# Patient Record
Sex: Female | Born: 1970 | Race: White | Hispanic: No | Marital: Single | Smoking: Never smoker
Health system: Southern US, Community
[De-identification: ages and names within clinical notes are randomized; demographics above are authoritative.]

## PROBLEM LIST (undated history)

## (undated) ENCOUNTER — Inpatient Hospital Stay (HOSPITAL_COMMUNITY): Payer: Self-pay

## (undated) DIAGNOSIS — I10 Essential (primary) hypertension: Secondary | ICD-10-CM

## (undated) DIAGNOSIS — E039 Hypothyroidism, unspecified: Secondary | ICD-10-CM

## (undated) DIAGNOSIS — K5900 Constipation, unspecified: Secondary | ICD-10-CM

## (undated) DIAGNOSIS — T8859XA Other complications of anesthesia, initial encounter: Secondary | ICD-10-CM

## (undated) DIAGNOSIS — R790 Abnormal level of blood mineral: Secondary | ICD-10-CM

## (undated) DIAGNOSIS — J189 Pneumonia, unspecified organism: Secondary | ICD-10-CM

## (undated) DIAGNOSIS — T4145XA Adverse effect of unspecified anesthetic, initial encounter: Secondary | ICD-10-CM

## (undated) HISTORY — PX: EXCISION MORTON'S NEUROMA: SHX5013

## (undated) HISTORY — PX: COLONOSCOPY: SHX174

---
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
## 1998-01-02 ENCOUNTER — Emergency Department (HOSPITAL_COMMUNITY): Admission: EM | Admit: 1998-01-02 | Discharge: 1998-01-02 | Payer: Self-pay | Admitting: Emergency Medicine

## 1998-03-22 ENCOUNTER — Other Ambulatory Visit: Admission: RE | Admit: 1998-03-22 | Discharge: 1998-03-22 | Payer: Self-pay | Admitting: Family Medicine

## 2001-10-15 ENCOUNTER — Other Ambulatory Visit: Admission: RE | Admit: 2001-10-15 | Discharge: 2001-10-15 | Payer: Self-pay | Admitting: Obstetrics and Gynecology

## 2002-04-08 ENCOUNTER — Other Ambulatory Visit: Admission: RE | Admit: 2002-04-08 | Discharge: 2002-04-08 | Payer: Self-pay | Admitting: Obstetrics and Gynecology

## 2005-08-31 ENCOUNTER — Ambulatory Visit (HOSPITAL_COMMUNITY): Admission: RE | Admit: 2005-08-31 | Discharge: 2005-08-31 | Payer: Self-pay | Admitting: Obstetrics and Gynecology

## 2005-11-12 ENCOUNTER — Other Ambulatory Visit: Admission: RE | Admit: 2005-11-12 | Discharge: 2005-11-12 | Payer: Self-pay | Admitting: Obstetrics and Gynecology

## 2010-10-07 ENCOUNTER — Encounter: Payer: Self-pay | Admitting: Obstetrics and Gynecology

## 2015-03-18 ENCOUNTER — Ambulatory Visit: Payer: Self-pay | Admitting: Family Medicine

## 2016-02-06 ENCOUNTER — Other Ambulatory Visit: Payer: Self-pay | Admitting: Orthopedic Surgery

## 2016-02-06 DIAGNOSIS — M5126 Other intervertebral disc displacement, lumbar region: Secondary | ICD-10-CM

## 2016-02-07 ENCOUNTER — Ambulatory Visit
Admission: RE | Admit: 2016-02-07 | Discharge: 2016-02-07 | Disposition: A | Discharge status: Home / Self Care | Payer: No Typology Code available for payment source | Source: Ambulatory Visit | Attending: Orthopedic Surgery | Admitting: Orthopedic Surgery

## 2016-02-07 DIAGNOSIS — M5126 Other intervertebral disc displacement, lumbar region: Secondary | ICD-10-CM

## 2016-03-09 ENCOUNTER — Encounter (HOSPITAL_COMMUNITY): Payer: Self-pay | Admitting: *Deleted

## 2016-03-09 ENCOUNTER — Other Ambulatory Visit: Payer: Self-pay | Admitting: Neurological Surgery

## 2016-03-09 NOTE — Progress Notes (Signed)
Pt denies cardiac history, chest pain or sob. 

## 2016-03-12 ENCOUNTER — Ambulatory Visit (HOSPITAL_COMMUNITY): Payer: BLUE CROSS/BLUE SHIELD | Admitting: Certified Registered Nurse Anesthetist

## 2016-03-12 ENCOUNTER — Encounter (HOSPITAL_COMMUNITY): Payer: Self-pay | Admitting: *Deleted

## 2016-03-12 ENCOUNTER — Encounter (HOSPITAL_COMMUNITY)
Admission: RE | Disposition: A | Discharge status: Home / Self Care | Payer: Self-pay | Source: Ambulatory Visit | Attending: Neurological Surgery

## 2016-03-12 ENCOUNTER — Other Ambulatory Visit: Payer: Self-pay

## 2016-03-12 ENCOUNTER — Ambulatory Visit (HOSPITAL_COMMUNITY): Payer: BLUE CROSS/BLUE SHIELD

## 2016-03-12 ENCOUNTER — Ambulatory Visit (HOSPITAL_COMMUNITY)
Admission: RE | Admit: 2016-03-12 | Discharge: 2016-03-12 | Disposition: A | Discharge status: Home / Self Care | Payer: BLUE CROSS/BLUE SHIELD | Source: Ambulatory Visit | Attending: Neurological Surgery | Admitting: Neurological Surgery

## 2016-03-12 DIAGNOSIS — E039 Hypothyroidism, unspecified: Secondary | ICD-10-CM | POA: Diagnosis not present

## 2016-03-12 DIAGNOSIS — I10 Essential (primary) hypertension: Secondary | ICD-10-CM | POA: Diagnosis not present

## 2016-03-12 DIAGNOSIS — M5127 Other intervertebral disc displacement, lumbosacral region: Secondary | ICD-10-CM | POA: Diagnosis present

## 2016-03-12 DIAGNOSIS — M5116 Intervertebral disc disorders with radiculopathy, lumbar region: Secondary | ICD-10-CM | POA: Insufficient documentation

## 2016-03-12 DIAGNOSIS — Z419 Encounter for procedure for purposes other than remedying health state, unspecified: Secondary | ICD-10-CM

## 2016-03-12 HISTORY — DX: Pneumonia, unspecified organism: J18.9

## 2016-03-12 HISTORY — DX: Essential (primary) hypertension: I10

## 2016-03-12 HISTORY — PX: LUMBAR LAMINECTOMY/DECOMPRESSION MICRODISCECTOMY: SHX5026

## 2016-03-12 HISTORY — DX: Other complications of anesthesia, initial encounter: T88.59XA

## 2016-03-12 HISTORY — DX: Hypothyroidism, unspecified: E03.9

## 2016-03-12 HISTORY — DX: Abnormal level of blood mineral: R79.0

## 2016-03-12 HISTORY — DX: Adverse effect of unspecified anesthetic, initial encounter: T41.45XA

## 2016-03-12 HISTORY — DX: Constipation, unspecified: K59.00

## 2016-03-12 LAB — CBC
HCT: 39.9 % (ref 36.0–46.0)
Hemoglobin: 13.5 g/dL (ref 12.0–15.0)
MCH: 32 pg (ref 26.0–34.0)
MCHC: 33.8 g/dL (ref 30.0–36.0)
MCV: 94.5 fL (ref 78.0–100.0)
PLATELETS: 220 10*3/uL (ref 150–400)
RBC: 4.22 MIL/uL (ref 3.87–5.11)
RDW: 12.5 % (ref 11.5–15.5)
WBC: 4.9 10*3/uL (ref 4.0–10.5)

## 2016-03-12 LAB — BASIC METABOLIC PANEL
Anion gap: 5 (ref 5–15)
BUN: 20 mg/dL (ref 6–20)
CALCIUM: 8.9 mg/dL (ref 8.9–10.3)
CHLORIDE: 110 mmol/L (ref 101–111)
CO2: 23 mmol/L (ref 22–32)
CREATININE: 0.69 mg/dL (ref 0.44–1.00)
GFR calc non Af Amer: >60 mL/min (ref >60)
GLUCOSE: 88 mg/dL (ref 65–99)
Potassium: 4.5 mmol/L (ref 3.5–5.1)
SODIUM: 138 mmol/L (ref 135–145)

## 2016-03-12 LAB — SURGICAL PCR SCREEN
MRSA, PCR: NEGATIVE
STAPHYLOCOCCUS AUREUS: NEGATIVE

## 2016-03-12 LAB — HCG, SERUM, QUALITATIVE: Preg, Serum: NEGATIVE

## 2016-03-12 SURGERY — LUMBAR LAMINECTOMY/DECOMPRESSION MICRODISCECTOMY 1 LEVEL
Anesthesia: General | Site: Spine Lumbar | Laterality: Right

## 2016-03-12 MED ORDER — ONDANSETRON HCL 4 MG/2ML IJ SOLN: 4.0000 mg | INTRAMUSCULAR | Status: DC | PRN

## 2016-03-12 MED ORDER — SODIUM CHLORIDE 0.9% FLUSH: 3.0000 mL | Freq: Two times a day (BID) | INTRAVENOUS | Status: DC

## 2016-03-12 MED ORDER — CEFAZOLIN IN D5W 1 GM/50ML IV SOLN
1.0000 g | Freq: Three times a day (TID) | INTRAVENOUS | Status: DC
  Administered 2016-03-12: 1 g via INTRAVENOUS
  Filled 2016-03-12: qty 50

## 2016-03-12 MED ORDER — GABAPENTIN 300 MG PO CAPS: 300.0000 mg | ORAL_CAPSULE | Freq: Three times a day (TID) | ORAL | Status: DC

## 2016-03-12 MED ORDER — VITAMIN K2 100 MCG PO CAPS: 100.0000 ug | ORAL_CAPSULE | Freq: Every day | ORAL | Status: DC

## 2016-03-12 MED ORDER — LIOTHYRONINE SODIUM 25 MCG PO TABS: 25.0000 ug | ORAL_TABLET | Freq: Every day | ORAL | Status: DC

## 2016-03-12 MED ORDER — CELECOXIB 200 MG PO CAPS
200.0000 mg | ORAL_CAPSULE | Freq: Two times a day (BID) | ORAL | Status: DC
  Administered 2016-03-12: 200 mg via ORAL
  Filled 2016-03-12: qty 1

## 2016-03-12 MED ORDER — LIDOCAINE 2% (20 MG/ML) 5 ML SYRINGE
INTRAMUSCULAR | Status: DC | PRN
  Administered 2016-03-12: 60 mg via INTRAVENOUS

## 2016-03-12 MED ORDER — KETOROLAC TROMETHAMINE 30 MG/ML IJ SOLN
INTRAMUSCULAR | Status: DC | PRN
  Administered 2016-03-12: 30 mg

## 2016-03-12 MED ORDER — BUPIVACAINE-EPINEPHRINE (PF) 0.5% -1:200000 IJ SOLN
INTRAMUSCULAR | Status: DC | PRN
  Administered 2016-03-12: 10 mL

## 2016-03-12 MED ORDER — HYDROMORPHONE HCL 1 MG/ML IJ SOLN
INTRAMUSCULAR | Status: AC
  Filled 2016-03-12: qty 1

## 2016-03-12 MED ORDER — THYROID 162.5 MG PO TABS: 162.5000 mg | ORAL_TABLET | Freq: Every day | ORAL | Status: DC

## 2016-03-12 MED ORDER — NEOSTIGMINE METHYLSULFATE 5 MG/5ML IV SOSY
PREFILLED_SYRINGE | INTRAVENOUS | Status: AC
  Filled 2016-03-12: qty 10

## 2016-03-12 MED ORDER — GLYCOPYRROLATE 0.2 MG/ML IV SOSY
PREFILLED_SYRINGE | INTRAVENOUS | Status: DC | PRN
  Administered 2016-03-12: .2 mg via INTRAVENOUS

## 2016-03-12 MED ORDER — PHENYLEPHRINE 40 MCG/ML (10ML) SYRINGE FOR IV PUSH (FOR BLOOD PRESSURE SUPPORT)
PREFILLED_SYRINGE | INTRAVENOUS | Status: AC
  Filled 2016-03-12: qty 30

## 2016-03-12 MED ORDER — KETOROLAC TROMETHAMINE 30 MG/ML IJ SOLN
INTRAMUSCULAR | Status: AC
  Filled 2016-03-12: qty 1

## 2016-03-12 MED ORDER — HYDROCODONE-ACETAMINOPHEN 7.5-325 MG PO TABS: 1.0000 | ORAL_TABLET | Freq: Four times a day (QID) | ORAL | Status: DC | PRN

## 2016-03-12 MED ORDER — 0.9 % SODIUM CHLORIDE (POUR BTL) OPTIME
TOPICAL | Status: DC | PRN
  Administered 2016-03-12: 1000 mL

## 2016-03-12 MED ORDER — THROMBIN 5000 UNITS EX SOLR
CUTANEOUS | Status: DC | PRN
  Administered 2016-03-12 (×2): 5000 [IU] via TOPICAL

## 2016-03-12 MED ORDER — POTASSIUM CHLORIDE IN NACL 20-0.9 MEQ/L-% IV SOLN
100.0000 mL/h | INTRAVENOUS | Status: DC
  Filled 2016-03-12 (×2): qty 1000

## 2016-03-12 MED ORDER — CEFAZOLIN SODIUM-DEXTROSE 2-4 GM/100ML-% IV SOLN
2.0000 g | INTRAVENOUS | Status: AC
  Administered 2016-03-12: 2 g via INTRAVENOUS
  Filled 2016-03-12: qty 100

## 2016-03-12 MED ORDER — PROPOFOL 10 MG/ML IV BOLUS
INTRAVENOUS | Status: DC | PRN
  Administered 2016-03-12: 130 mg via INTRAVENOUS

## 2016-03-12 MED ORDER — BISACODYL 10 MG RE SUPP
10.0000 mg | Freq: Every day | RECTAL | Status: DC | PRN
Start: 2016-03-12 — End: 2016-03-13

## 2016-03-12 MED ORDER — NEOSTIGMINE METHYLSULFATE 5 MG/5ML IV SOSY
PREFILLED_SYRINGE | INTRAVENOUS | Status: DC | PRN
  Administered 2016-03-12: 2 mg via INTRAVENOUS

## 2016-03-12 MED ORDER — METHYLPREDNISOLONE ACETATE 80 MG/ML IJ SUSP
INTRAMUSCULAR | Status: DC | PRN
  Administered 2016-03-12: 80 mg

## 2016-03-12 MED ORDER — LIDOCAINE 2% (20 MG/ML) 5 ML SYRINGE
INTRAMUSCULAR | Status: AC
  Filled 2016-03-12: qty 15

## 2016-03-12 MED ORDER — ADRENAL 200 MG PO CAPS: ORAL_CAPSULE | Freq: Every day | ORAL | Status: DC

## 2016-03-12 MED ORDER — BUPIVACAINE LIPOSOME 1.3 % IJ SUSP: INTRAMUSCULAR | Status: DC | PRN

## 2016-03-12 MED ORDER — ROCURONIUM BROMIDE 10 MG/ML (PF) SYRINGE
PREFILLED_SYRINGE | INTRAVENOUS | Status: DC | PRN
  Administered 2016-03-12: 40 mg via INTRAVENOUS

## 2016-03-12 MED ORDER — OXYCODONE HCL ER 10 MG PO T12A
20.0000 mg | EXTENDED_RELEASE_TABLET | Freq: Two times a day (BID) | ORAL | Status: DC
  Administered 2016-03-12: 20 mg via ORAL
  Filled 2016-03-12: qty 2

## 2016-03-12 MED ORDER — HYDROMORPHONE HCL 1 MG/ML IJ SOLN
0.2500 mg | INTRAMUSCULAR | Status: DC | PRN
  Administered 2016-03-12 (×2): 0.5 mg via INTRAVENOUS

## 2016-03-12 MED ORDER — MUPIROCIN 2 % EX OINT
1.0000 "application " | TOPICAL_OINTMENT | Freq: Once | CUTANEOUS | Status: AC
  Administered 2016-03-12: 1 via TOPICAL
  Filled 2016-03-12: qty 22

## 2016-03-12 MED ORDER — BIOTIN 1 MG PO CAPS: 1.0000 mg | ORAL_CAPSULE | Freq: Every day | ORAL | Status: DC

## 2016-03-12 MED ORDER — PROMETHAZINE HCL 25 MG/ML IJ SOLN: 6.2500 mg | INTRAMUSCULAR | Status: DC | PRN

## 2016-03-12 MED ORDER — MIDAZOLAM HCL 5 MG/5ML IJ SOLN
INTRAMUSCULAR | Status: DC | PRN
  Administered 2016-03-12: 2 mg via INTRAVENOUS
  Administered 2016-03-12 (×2): 1 mg via INTRAVENOUS

## 2016-03-12 MED ORDER — ACETAMINOPHEN 10 MG/ML IV SOLN
INTRAVENOUS | Status: AC
  Filled 2016-03-12: qty 100

## 2016-03-12 MED ORDER — MIDAZOLAM HCL 2 MG/2ML IJ SOLN
INTRAMUSCULAR | Status: AC
  Filled 2016-03-12: qty 2

## 2016-03-12 MED ORDER — GERMANIUM 10 MG PO CAPS: 10.0000 mg | ORAL_CAPSULE | Freq: Every day | ORAL | Status: DC

## 2016-03-12 MED ORDER — FLEET ENEMA 7-19 GM/118ML RE ENEM: 1.0000 | ENEMA | Freq: Once | RECTAL | Status: DC | PRN

## 2016-03-12 MED ORDER — ONDANSETRON HCL 4 MG/2ML IJ SOLN
INTRAMUSCULAR | Status: DC | PRN
  Administered 2016-03-12: 4 mg via INTRAVENOUS

## 2016-03-12 MED ORDER — MUPIROCIN 2 % EX OINT: 1.0000 "application " | TOPICAL_OINTMENT | Freq: Once | CUTANEOUS | Status: DC

## 2016-03-12 MED ORDER — MAGNESIUM CITRATE PO SOLN: 1.0000 | Freq: Once | ORAL | Status: DC | PRN

## 2016-03-12 MED ORDER — PYRIDOXINE HCL 25 MG PO TABS: 25.0000 mg | ORAL_TABLET | Freq: Every day | ORAL | Status: DC

## 2016-03-12 MED ORDER — VITAMIN D 1000 UNITS PO TABS: 1000.0000 [IU] | ORAL_TABLET | Freq: Every day | ORAL | Status: DC

## 2016-03-12 MED ORDER — OXYCODONE HCL 5 MG PO TABS: 5.0000 mg | ORAL_TABLET | ORAL | Status: DC | PRN

## 2016-03-12 MED ORDER — PHENOL 1.4 % MT LIQD: 1.0000 | OROMUCOSAL | Status: DC | PRN

## 2016-03-12 MED ORDER — CHLORHEXIDINE GLUCONATE CLOTH 2 % EX PADS: 6.0000 | MEDICATED_PAD | Freq: Once | CUTANEOUS | Status: DC

## 2016-03-12 MED ORDER — VITAMIN C 500 MG PO CAPS
500.0000 mg | ORAL_CAPSULE | Freq: Every day | ORAL | Status: DC
Start: 2016-03-12 — End: 2016-03-12

## 2016-03-12 MED ORDER — DOCUSATE SODIUM 100 MG PO CAPS: 100.0000 mg | ORAL_CAPSULE | Freq: Two times a day (BID) | ORAL | Status: DC

## 2016-03-12 MED ORDER — B COMPLEX PO TABS: 1.0000 | ORAL_TABLET | Freq: Every day | ORAL | Status: DC

## 2016-03-12 MED ORDER — MAGNESIUM SULFATE 70 MG PO CAPS: 70.0000 mg | ORAL_CAPSULE | Freq: Every day | ORAL | Status: DC

## 2016-03-12 MED ORDER — GLYCOPYRROLATE 0.2 MG/ML IV SOSY
PREFILLED_SYRINGE | INTRAVENOUS | Status: AC
Start: 2016-03-12 — End: 2016-03-12
  Filled 2016-03-12: qty 6

## 2016-03-12 MED ORDER — ALUM & MAG HYDROXIDE-SIMETH 200-200-20 MG/5ML PO SUSP: 30.0000 mL | Freq: Four times a day (QID) | ORAL | Status: DC | PRN

## 2016-03-12 MED ORDER — SODIUM CHLORIDE 0.9 % IR SOLN
Status: DC | PRN
  Administered 2016-03-12: 500 mL

## 2016-03-12 MED ORDER — FENTANYL CITRATE (PF) 250 MCG/5ML IJ SOLN
INTRAMUSCULAR | Status: AC
  Filled 2016-03-12: qty 5

## 2016-03-12 MED ORDER — METHOCARBAMOL 750 MG PO TABS: 750.0000 mg | ORAL_TABLET | Freq: Four times a day (QID) | ORAL | Status: DC

## 2016-03-12 MED ORDER — PHENYLEPHRINE 40 MCG/ML (10ML) SYRINGE FOR IV PUSH (FOR BLOOD PRESSURE SUPPORT)
PREFILLED_SYRINGE | INTRAVENOUS | Status: DC | PRN
  Administered 2016-03-12: 120 ug via INTRAVENOUS

## 2016-03-12 MED ORDER — PANTOPRAZOLE SODIUM 20 MG PO TBEC
20.0000 mg | DELAYED_RELEASE_TABLET | Freq: Every day | ORAL | Status: DC
Start: 2016-03-12 — End: 2016-03-13
  Filled 2016-03-12: qty 1

## 2016-03-12 MED ORDER — HEMOSTATIC AGENTS (NO CHARGE) OPTIME
TOPICAL | Status: DC | PRN
  Administered 2016-03-12: 1 via TOPICAL

## 2016-03-12 MED ORDER — DEXAMETHASONE SODIUM PHOSPHATE 10 MG/ML IJ SOLN
INTRAMUSCULAR | Status: DC | PRN
  Administered 2016-03-12: 5 mg via INTRAVENOUS

## 2016-03-12 MED ORDER — DEXAMETHASONE SODIUM PHOSPHATE 10 MG/ML IJ SOLN
INTRAMUSCULAR | Status: AC
  Filled 2016-03-12: qty 1

## 2016-03-12 MED ORDER — CEFAZOLIN SODIUM 1 G IJ SOLR
INTRAMUSCULAR | Status: AC
  Filled 2016-03-12: qty 20

## 2016-03-12 MED ORDER — SODIUM CHLORIDE 0.9 % IJ SOLN: INTRAMUSCULAR | Status: DC | PRN

## 2016-03-12 MED ORDER — MENTHOL 3 MG MT LOZG: 1.0000 | LOZENGE | OROMUCOSAL | Status: DC | PRN

## 2016-03-12 MED ORDER — HYDROCHLOROTHIAZIDE 12.5 MG PO CAPS: 12.5000 mg | ORAL_CAPSULE | Freq: Every day | ORAL | Status: DC

## 2016-03-12 MED ORDER — BUPIVACAINE HCL (PF) 0.25 % IJ SOLN
INTRAMUSCULAR | Status: DC | PRN
  Administered 2016-03-12: 1 mL

## 2016-03-12 MED ORDER — BUPIVACAINE LIPOSOME 1.3 % IJ SUSP
20.0000 mL | INTRAMUSCULAR | Status: DC
  Filled 2016-03-12: qty 20

## 2016-03-12 MED ORDER — FENTANYL CITRATE (PF) 100 MCG/2ML IJ SOLN
INTRAMUSCULAR | Status: DC | PRN
  Administered 2016-03-12: 25 ug via INTRAVENOUS
  Administered 2016-03-12: 50 ug via INTRAVENOUS
  Administered 2016-03-12 (×2): 25 ug via INTRAVENOUS
  Administered 2016-03-12: 50 ug via INTRAVENOUS

## 2016-03-12 MED ORDER — PHENYLEPHRINE 40 MCG/ML (10ML) SYRINGE FOR IV PUSH (FOR BLOOD PRESSURE SUPPORT)
PREFILLED_SYRINGE | INTRAVENOUS | Status: AC
  Filled 2016-03-12: qty 10

## 2016-03-12 MED ORDER — SODIUM CHLORIDE 0.9% FLUSH: 3.0000 mL | INTRAVENOUS | Status: DC | PRN

## 2016-03-12 MED ORDER — ONDANSETRON HCL 4 MG/2ML IJ SOLN
INTRAMUSCULAR | Status: AC
  Filled 2016-03-12: qty 6

## 2016-03-12 MED ORDER — METHOCARBAMOL 750 MG PO TABS
750.0000 mg | ORAL_TABLET | Freq: Four times a day (QID) | ORAL | Status: DC
  Administered 2016-03-12: 750 mg via ORAL
  Filled 2016-03-12: qty 1

## 2016-03-12 MED ORDER — SENNA 8.6 MG PO TABS
1.0000 | ORAL_TABLET | Freq: Two times a day (BID) | ORAL | Status: DC
Start: 2016-03-12 — End: 2016-03-13

## 2016-03-12 MED ORDER — BARBERRY-OREG GRAPE-GOLDENSEAL 200-200-50 MG PO CAPS: 1.0000 | ORAL_CAPSULE | Freq: Every day | ORAL | Status: DC

## 2016-03-12 MED ORDER — GABAPENTIN 300 MG PO CAPS
300.0000 mg | ORAL_CAPSULE | Freq: Three times a day (TID) | ORAL | Status: DC
  Administered 2016-03-12: 300 mg via ORAL
  Filled 2016-03-12: qty 1

## 2016-03-12 MED ORDER — LACTATED RINGERS IV SOLN
INTRAVENOUS | Status: DC
  Administered 2016-03-12 (×2): via INTRAVENOUS

## 2016-03-12 MED ORDER — LIDOCAINE-EPINEPHRINE 2 %-1:100000 IJ SOLN
INTRAMUSCULAR | Status: DC | PRN
  Administered 2016-03-12: 10 mL

## 2016-03-12 MED ORDER — ACETAMINOPHEN 500 MG PO TABS
1000.0000 mg | ORAL_TABLET | Freq: Four times a day (QID) | ORAL | Status: DC
  Administered 2016-03-12: 1000 mg via ORAL
  Filled 2016-03-12: qty 2

## 2016-03-12 MED ORDER — TESTOSTERONE CYPIONATE 100 MG/ML IM SOLN: 100.0000 mg | Freq: Once | INTRAMUSCULAR | Status: DC

## 2016-03-12 MED ORDER — ROCURONIUM BROMIDE 50 MG/5ML IV SOLN
INTRAVENOUS | Status: AC
  Filled 2016-03-12: qty 4

## 2016-03-12 MED ORDER — EPHEDRINE 5 MG/ML INJ
INTRAVENOUS | Status: AC
  Filled 2016-03-12: qty 30

## 2016-03-12 MED ORDER — ACETAMINOPHEN 10 MG/ML IV SOLN
INTRAVENOUS | Status: DC | PRN
  Administered 2016-03-12: 1000 mg via INTRAVENOUS

## 2016-03-12 MED ORDER — THROMBIN 5000 UNITS EX SOLR
OROMUCOSAL | Status: DC | PRN
  Administered 2016-03-12: 5 mL via TOPICAL

## 2016-03-12 SURGICAL SUPPLY — 71 items
ADH SKN CLS APL DERMABOND .7 (GAUZE/BANDAGES/DRESSINGS) ×1
APL SKNCLS STERI-STRIP NONHPOA (GAUZE/BANDAGES/DRESSINGS)
BAG DECANTER FOR FLEXI CONT (MISCELLANEOUS) ×3 IMPLANT
BENZOIN TINCTURE PRP APPL 2/3 (GAUZE/BANDAGES/DRESSINGS) IMPLANT
BIT DRILL NEURO 2X3.1 SFT TUCH (MISCELLANEOUS) ×1 IMPLANT
BLADE CLIPPER SURG (BLADE) IMPLANT
BLADE SURG 11 STRL SS (BLADE) ×3 IMPLANT
BUR ROUND FLUTED 5 RND (BURR) ×2 IMPLANT
BUR ROUND FLUTED 5MM RND (BURR) ×1
CANISTER SUCT 3000ML PPV (MISCELLANEOUS) ×6 IMPLANT
CHLORAPREP W/TINT 26ML (MISCELLANEOUS) ×3 IMPLANT
CLOSURE WOUND 1/2 X4 (GAUZE/BANDAGES/DRESSINGS)
DECANTER SPIKE VIAL GLASS SM (MISCELLANEOUS) ×3 IMPLANT
DERMABOND ADVANCED (GAUZE/BANDAGES/DRESSINGS) ×2
DERMABOND ADVANCED .7 DNX12 (GAUZE/BANDAGES/DRESSINGS) ×1 IMPLANT
DRAPE MICROSCOPE LEICA (MISCELLANEOUS) ×3 IMPLANT
DRAPE POUCH INSTRU U-SHP 10X18 (DRAPES) ×3 IMPLANT
DRAPE SURG 17X23 STRL (DRAPES) ×3 IMPLANT
DRILL NEURO 2X3.1 SOFT TOUCH (MISCELLANEOUS) ×3
DRSG OPSITE POSTOP 3X4 (GAUZE/BANDAGES/DRESSINGS) ×2 IMPLANT
ELECT BLADE 4.0 EZ CLEAN MEGAD (MISCELLANEOUS) ×3
ELECT REM PT RETURN 9FT ADLT (ELECTROSURGICAL) ×3
ELECTRODE BLDE 4.0 EZ CLN MEGD (MISCELLANEOUS) IMPLANT
ELECTRODE REM PT RTRN 9FT ADLT (ELECTROSURGICAL) ×1 IMPLANT
GAUZE SPONGE 4X4 12PLY STRL (GAUZE/BANDAGES/DRESSINGS) IMPLANT
GAUZE SPONGE 4X4 16PLY XRAY LF (GAUZE/BANDAGES/DRESSINGS) IMPLANT
GLOVE BIOGEL PI IND STRL 7.0 (GLOVE) IMPLANT
GLOVE BIOGEL PI IND STRL 7.5 (GLOVE) ×1 IMPLANT
GLOVE BIOGEL PI IND STRL 8 (GLOVE) IMPLANT
GLOVE BIOGEL PI INDICATOR 7.0 (GLOVE) ×2
GLOVE BIOGEL PI INDICATOR 7.5 (GLOVE) ×4
GLOVE BIOGEL PI INDICATOR 8 (GLOVE) ×2
GLOVE ECLIPSE 7.5 STRL STRAW (GLOVE) ×4 IMPLANT
GLOVE EXAM NITRILE LRG STRL (GLOVE) IMPLANT
GLOVE EXAM NITRILE MD LF STRL (GLOVE) IMPLANT
GLOVE EXAM NITRILE XL STR (GLOVE) IMPLANT
GLOVE EXAM NITRILE XS STR PU (GLOVE) IMPLANT
GLOVE SS BIOGEL STRL SZ 7 (GLOVE) ×2 IMPLANT
GLOVE SUPERSENSE BIOGEL SZ 7 (GLOVE) ×4
GOWN STRL REUS W/ TWL LRG LVL3 (GOWN DISPOSABLE) ×1 IMPLANT
GOWN STRL REUS W/ TWL XL LVL3 (GOWN DISPOSABLE) IMPLANT
GOWN STRL REUS W/TWL LRG LVL3 (GOWN DISPOSABLE) ×3
GOWN STRL REUS W/TWL XL LVL3 (GOWN DISPOSABLE) ×6
HEMOSTAT POWDER KIT SURGIFOAM (HEMOSTASIS) ×3 IMPLANT
KIT BASIN OR (CUSTOM PROCEDURE TRAY) ×3 IMPLANT
KIT ROOM TURNOVER OR (KITS) ×3 IMPLANT
LIGHT SOURCE ANGLE TIP STR 7FT (MISCELLANEOUS) ×2 IMPLANT
NDL HYPO 21X1.5 SAFETY (NEEDLE) ×1 IMPLANT
NDL HYPO 25X1 1.5 SAFETY (NEEDLE) ×1 IMPLANT
NEEDLE HYPO 21X1.5 SAFETY (NEEDLE) ×6 IMPLANT
NEEDLE HYPO 25X1 1.5 SAFETY (NEEDLE) ×3 IMPLANT
NS IRRIG 1000ML POUR BTL (IV SOLUTION) ×3 IMPLANT
PACK LAMINECTOMY NEURO (CUSTOM PROCEDURE TRAY) ×3 IMPLANT
PACK UNIVERSAL I (CUSTOM PROCEDURE TRAY) ×3 IMPLANT
PAD ARMBOARD 7.5X6 YLW CONV (MISCELLANEOUS) ×9 IMPLANT
PATTIES SURGICAL .5X1.5 (GAUZE/BANDAGES/DRESSINGS) ×3 IMPLANT
RUBBERBAND STERILE (MISCELLANEOUS) ×6 IMPLANT
SPONGE NEURO XRAY DETECT 1X3 (DISPOSABLE) ×3 IMPLANT
SPONGE SURGIFOAM ABS GEL SZ50 (HEMOSTASIS) ×3 IMPLANT
STRIP CLOSURE SKIN 1/2X4 (GAUZE/BANDAGES/DRESSINGS) IMPLANT
SUT VIC AB 0 CT1 18XCR BRD8 (SUTURE) ×1 IMPLANT
SUT VIC AB 0 CT1 8-18 (SUTURE) ×3
SUT VIC AB 2-0 CT1 18 (SUTURE) ×3 IMPLANT
SUT VIC AB 4-0 PS2 27 (SUTURE) ×3 IMPLANT
SYR 20CC LL (SYRINGE) ×3 IMPLANT
SYR 30ML LL (SYRINGE) ×5 IMPLANT
TOWEL OR 17X24 6PK STRL BLUE (TOWEL DISPOSABLE) ×3 IMPLANT
TOWEL OR 17X26 10 PK STRL BLUE (TOWEL DISPOSABLE) ×3 IMPLANT
TUBE CONNECTING 12'X1/4 (SUCTIONS) ×1
TUBE CONNECTING 12X1/4 (SUCTIONS) ×2 IMPLANT
WATER STERILE IRR 1000ML POUR (IV SOLUTION) ×3 IMPLANT

## 2016-03-12 NOTE — Progress Notes (Signed)
PHARMACIST - PHYSICIAN ORDER COMMUNICATION  CONCERNING: P&T Medication Policy on Herbal Medications  DESCRIPTION:  This patient's order for: multiple OTC products -- have been stopped  This product(s) is classified as an "herbal" or natural product. Due to a lack of definitive safety studies or FDA approval, nonstandard manufacturing practices, plus the potential risk of unknown drug-drug interactions while on inpatient medications, the Pharmacy and Therapeutics Committee does not permit the use of "herbal" or natural products of this type within [facility].   ACTION TAKEN: The pharmacy department is unable to verify this order at this time and your patient has been informed of this safety policy. Please reevaluate patient's clinical condition at discharge and address if the herbal or natural product(s) should be resumed at that time.

## 2016-03-12 NOTE — Anesthesia Preprocedure Evaluation (Addendum)
Anesthesia Evaluation  Patient identified by MRN, date of birth, ID band Patient awake    Reviewed: Allergy & Precautions, NPO status , Patient's Chart, lab work & pertinent test results  Airway Mallampati: I  TM Distance: >3 FB Neck ROM: Full    Dental  (+) Dental Advisory Given, Teeth Intact   Pulmonary neg pulmonary ROS,    breath sounds clear to auscultation       Cardiovascular hypertension, Pt. on medications  Rhythm:Regular Rate:Normal     Neuro/Psych negative neurological ROS     GI/Hepatic negative GI ROS, Neg liver ROS,   Endo/Other  Hypothyroidism   Renal/GU negative Renal ROS     Musculoskeletal negative musculoskeletal ROS (+)   Abdominal   Peds  Hematology negative hematology ROS (+)   Anesthesia Other Findings   Reproductive/Obstetrics                           Lab Results  Component Value Date   WBC 4.9 03/12/2016   HGB 13.5 03/12/2016   HCT 39.9 03/12/2016   MCV 94.5 03/12/2016   PLT 220 03/12/2016   Lab Results  Component Value Date   CREATININE 0.69 03/12/2016   BUN 20 03/12/2016   NA 138 03/12/2016   K 4.5 03/12/2016   CL 110 03/12/2016   CO2 23 03/12/2016    Anesthesia Physical Anesthesia Plan  ASA: II  Anesthesia Plan: General   Post-op Pain Management:    Induction: Intravenous  Airway Management Planned: Oral ETT  Additional Equipment:   Intra-op Plan:   Post-operative Plan: Extubation in OR  Informed Consent: I have reviewed the patients History and Physical, chart, labs and discussed the procedure including the risks, benefits and alternatives for the proposed anesthesia with the patient or authorized representative who has indicated his/her understanding and acceptance.   Dental advisory given  Plan Discussed with: CRNA  Anesthesia Plan Comments:         Anesthesia Quick Evaluation

## 2016-03-12 NOTE — Anesthesia Postprocedure Evaluation (Signed)
Anesthesia Post Note  Patient: Michelle Mann  Procedure(s) Performed: Procedure(s) (LRB): Right Lumbar five-dacral-one Microdiskectomy (Right)  Patient location during evaluation: PACU Anesthesia Type: General Level of consciousness: awake and alert Pain management: pain level controlled Vital Signs Assessment: post-procedure vital signs reviewed and stable Respiratory status: spontaneous breathing, nonlabored ventilation and respiratory function stable Cardiovascular status: blood pressure returned to baseline and stable Postop Assessment: no signs of nausea or vomiting Anesthetic complications: no    Last Vitals:  Filed Vitals:   03/12/16 1522 03/12/16 1548  BP:  118/64  Pulse:  65  Temp: 36.4 C 36.8 C  Resp:  16    Last Pain:  Filed Vitals:   03/12/16 1601  PainSc: 8                  Calie Buttrey A

## 2016-03-12 NOTE — Op Note (Signed)
03/12/2016  1:57 PM  PATIENT:  Michelle Mann  45 y.o. female  PRE-OPERATIVE DIAGNOSIS:  Lumbar radiculopathy, herniated nucleus pulposus right L5-S1  POST-OPERATIVE DIAGNOSIS:  Same  PROCEDURE:  Right L5-S1 microdiscectomy  SURGEON:  Aldean Ast, MD  ASSISTANTS: Jovita Gamma, M.D.  ANESTHESIA:   General  DRAINS: None   SPECIMEN:  None  INDICATION FOR PROCEDURE: 66 showed woman with intractable right leg pain due to herniated nucleus pulposus at L5-S1. I recommended the above listed operation. Patient understood the risks, benefits, and alternatives and potential outcomes and wished to proceed.  PROCEDURE DETAILS: After smooth induction of general endotracheal anesthesia the patient was turned prone on the operating table on a Wilson frame.  The skin of the lumbar region was clipped of hair. It was wiped down with alcohol. The patient was then prepped and draped in usual sterile fashion.   Two spinal needles were inserted 1.5 cm right of the midline at approximately the L5-S1 level.  Intraoperative fluoroscopy was used to determine the level of the appropriate needle. The skin around this needle was infiltrated with a mixture of lidocaine and Marcaine with epinephrine. An approximately 2-1/2 cm incision was then made at this location.  Monopolar cautery was used to dissect through the subcutaneous tissues to the lumbodorsal fascia. This was then penetrated with a dilator. The level was then confirmed. The dilator was used to scrape the muscle from the lamina at the appropriate level. The tract was sequentially dilated and then the Maxcess retractor was inserted over the dilators.  The operating microscope was brought into the field. Using microsurgical technique the remaining adherent soft tissue was removed from the lamina. The high-speed drill was used to thin the inferior lamina of L5 and superior lamina of S1.  The hemilaminectomy was completed with Kerrison punches and  pituitary rongeurs.  The ligamentum flavum was elevated with a nerve hook and then resected with Kerrison punches. Epidural fat was suctioned away to reveal the thecal sac. Residual ligament was resected.  The S1 nerve root was identified. This was medialized.  The disc space was identified.  The annulus was opened sharply. A free disc fragment was identified and this was resected using pituitary rongeurs and Kerrisons. After this was complete I palpated the ventral edge of the thecal sac and nerve root to determine if there was no residual compression. I was satisfied that this was the case.  I irrigated vigorously with bacitracin saline. There was excellent hemostasis. I injected an concoction of plain Marcaine, Toradol, and Depo-Medrol into the epidural space.  The wound was closed in routine anatomic layers using interrupted Vicryl sutures. The skin was sealed with Dermabond.   PATIENT DISPOSITION:  PACU - hemodynamically stable.   Delay start of Pharmacological VTE agent (>24hrs) due to surgical blood loss or risk of bleeding:  yes '

## 2016-03-12 NOTE — Progress Notes (Signed)
Discharged instructions/education/ Rx given to patient with family at bedside and patient verbalized understanding. Patient moving and ambulating well. Emptying her bladder well. Tolerated dinner well with no complaints. Pain is mild to moderate per patient. No drainage,no redness and no swelling noted on incision site. Patient ambulated with family and NT to her transportation.

## 2016-03-12 NOTE — Transfer of Care (Signed)
Immediate Anesthesia Transfer of Care Note  Patient: Michelle Mann  Procedure(s) Performed: Procedure(s) with comments: Right Lumbar five-dacral-one Microdiskectomy (Right) - right  Patient Location: PACU  Anesthesia Type:General  Level of Consciousness: awake, alert  and oriented  Airway & Oxygen Therapy: Patient Spontanous Breathing  Post-op Assessment: Report given to RN and Post -op Vital signs reviewed and stable  Post vital signs: Reviewed and stable  Last Vitals:  Filed Vitals:   03/12/16 0716  BP: 120/62  Pulse: 89  Temp: 36.8 C  Resp: 20    Last Pain:  Filed Vitals:   03/12/16 0812  PainSc: 8       Patients Stated Pain Goal: 2 ([id number])  Complications: No apparent anesthesia complications

## 2016-03-12 NOTE — Anesthesia Procedure Notes (Signed)
Procedure Name: Intubation Date/Time: 03/12/2016 1:37 PM Performed by: Merdis Delay Pre-anesthesia Checklist: Patient identified, Emergency Drugs available, Suction available, Patient being monitored and Timeout performed Patient Re-evaluated:Patient Re-evaluated prior to inductionOxygen Delivery Method: Circle system utilized Preoxygenation: Pre-oxygenation with 100% oxygen Intubation Type: IV induction Ventilation: Mask ventilation without difficulty Laryngoscope Size: Mac and 3 Grade View: Grade I Tube type: Oral Tube size: 7.0 mm Number of attempts: 1 Placement Confirmation: ETT inserted through vocal cords under direct vision,  positive ETCO2,  CO2 detector and breath sounds checked- equal and bilateral Secured at: 22 cm Tube secured with: Tape Dental Injury: Teeth and Oropharynx as per pre-operative assessment  Comments: Preformed by Melynda Ripple CRNA

## 2016-03-12 NOTE — H&P (Signed)
CC:  No chief complaint on file.   HPI: Michelle Mann is a 45 y.o. female with medically intractable right leg pain.  She has a disc herniation at L5-S1 on the right.  She presents for discectomy.  PMH: Past Medical History  Diagnosis Date  . Hypothyroidism   . Hypertension     none since weight loss  . Pneumonia   . Constipation   . Complication of anesthesia     woke up during neuroma surgery  . Raised serum iron     PSH: Past Surgical History  Procedure Laterality Date  . Wisdom tooth extraction  1986  . Excision morton's neuroma Left     foot  . Colonoscopy      SH: Social History  Substance Use Topics  . Smoking status: Never Smoker   . Smokeless tobacco: Never Used  . Alcohol Use: Yes     Comment: socially    MEDS: Prior to Admission medications   Medication Sig Start Date End Date Taking? Authorizing Provider  ALOE PO Take 1 capsule by mouth daily.   Yes Historical Provider, MD  Ascorbic Acid (VITAMIN C) 500 MG CAPS Take 500 mg by mouth daily.   Yes Historical Provider, MD  b complex vitamins tablet Take 1 tablet by mouth daily.   Yes Historical Provider, MD  Barberry-Oreg Grape-Goldenseal 200-200-50 MG CAPS Take 1 tablet by mouth daily.   Yes Historical Provider, MD  Biotin 1 MG CAPS Take 1 mg by mouth daily.   Yes Historical Provider, MD  Cholecalciferol (VITAMIN D3) 1000 units CAPS Take 1,000 Units by mouth daily.   Yes Historical Provider, MD  hydrochlorothiazide (MICROZIDE) 12.5 MG capsule Take 12.5 mg by mouth daily.   Yes Historical Provider, MD  LIOTHYRONINE SODIUM PO Take 25 mcg by mouth daily. This a compounded medication - Liothyronine Sodium P3SR   Yes Historical Provider, MD  magnesium citrate SOLN Take 1 Bottle by mouth once as needed for mild constipation or severe constipation.   Yes Historical Provider, MD  MAGNESIUM GLYCINATE PLUS PO Take 1 tablet by mouth daily. In addition to mg sulfate   Yes Historical Provider, MD  Magnesium Sulfate 70  MG CAPS Take 70 mg by mouth daily.   Yes Historical Provider, MD  Menaquinone-7 (VITAMIN K2) 100 MCG CAPS Take 100 mcg by mouth daily.   Yes Historical Provider, MD  Misc Natural Products (ADRENAL PO) Take 1 tablet by mouth daily.   Yes Historical Provider, MD  Misc Natural Products (GERMANIUM) 10 MG CAPS Take 10 mg by mouth daily.   Yes Historical Provider, MD  Multiple Vitamins-Minerals (ZINC) LOZG Take 1 lozenge by mouth daily as needed. supplement   Yes Historical Provider, MD  Omega-3 Fatty Acids (FISH OIL) 1000 MG CAPS Take 1,000 mg by mouth daily.   Yes Historical Provider, MD  pyridOXINE (VITAMIN B-6) 25 MG tablet Take 25 mg by mouth daily.   Yes Historical Provider, MD  testosterone cypionate (DEPOTESTOTERONE CYPIONATE) 100 MG/ML injection Inject 100 mg into the muscle once. For IM use only-pt reports she will not have another injection per MD-had injection two weeks prior   Yes Historical Provider, MD  Thyroid 162.5 MG TABS Take 162.5 mg by mouth daily. Nature Thyroid   Yes Historical Provider, MD    ALLERGY: Allergies  Allergen Reactions  . Codeine Other (See Comments)    AGITATION    ROS: ROS  NEUROLOGIC EXAM: Awake, alert, oriented Memory and concentration grossly intact Speech  fluent, appropriate CN grossly intact Motor exam: Upper Extremities Deltoid Bicep Tricep Grip  Right 5/5 5/5 5/5 5/5  Left 5/5 5/5 5/5 5/5   Lower Extremity IP Quad PF DF EHL  Right 5/5 5/5 5/5 5/5 5/5  Left 5/5 5/5 5/5 5/5 5/5   Sensation grossly intact to LT  IMAGING: No new imaging  IMPRESSION: - 45 y.o. female with lumbar radiculopathy and right L5-S1 disc herniation  PLAN: - Right L5-S1 discectomy - I had a long discussion with the patient and discussed the risks and benefits of the operation as well as the alternatives.  She understands and wishes to proceed.

## 2016-03-12 NOTE — Discharge Summary (Signed)
Date of Admission: 03/12/2016  Date of Discharge: 03/12/2016  Admission Diagnosis: Lumbar radiculopathy, herniated nucleus pulposus right L5-S1  Discharge Diagnosis: Same   Procedure Performed: Right L5-S1 microdiscectomy  Attending: Kevan Ny Prestyn Mahn, MD  Hospital Course:  The patient was admitted for the above listed operation and had an uncomplicated post-operative course.  They were discharged in stable condition.  Follow up: 3 weeks    Medication List    TAKE these medications        ALOE PO  Take 1 capsule by mouth daily.     b complex vitamins tablet  Take 1 tablet by mouth daily.     Barberry-Oreg Grape-Goldenseal 200-200-50 MG Caps  Take 1 tablet by mouth daily.     Biotin 1 MG Caps  Take 1 mg by mouth daily.     Fish Oil 1000 MG Caps  Take 1,000 mg by mouth daily.     gabapentin 300 MG capsule  Commonly known as:  NEURONTIN  Take 1 capsule (300 mg total) by mouth 3 (three) times daily.     Germanium 10 MG Caps  Take 10 mg by mouth daily.     ADRENAL PO  Take 1 tablet by mouth daily.     hydrochlorothiazide 12.5 MG capsule  Commonly known as:  MICROZIDE  Take 12.5 mg by mouth daily.     HYDROcodone-acetaminophen 7.5-325 MG tablet  Commonly known as:  NORCO  Take 1 tablet by mouth every 6 (six) hours as needed for moderate pain.     LIOTHYRONINE SODIUM PO  Take 25 mcg by mouth daily. This a compounded medication - Liothyronine Sodium P3SR     magnesium citrate Soln  Take 1 Bottle by mouth once as needed for mild constipation or severe constipation.     MAGNESIUM GLYCINATE PLUS PO  Take 1 tablet by mouth daily. In addition to mg sulfate     Magnesium Sulfate 70 MG Caps  Take 70 mg by mouth daily.     methocarbamol 750 MG tablet  Commonly known as:  ROBAXIN  Take 1 tablet (750 mg total) by mouth 4 (four) times daily.     pyridOXINE 25 MG tablet  Commonly known as:  VITAMIN B-6  Take 25 mg by mouth daily.     testosterone cypionate 100  MG/ML injection  Commonly known as:  DEPOTESTOTERONE CYPIONATE  Inject 100 mg into the muscle once. For IM use only-pt reports she will not have another injection per MD-had injection two weeks prior     Thyroid 162.5 MG Tabs  Take 162.5 mg by mouth daily. Nature Thyroid     Vitamin C 500 MG Caps  Take 500 mg by mouth daily.     Vitamin D3 1000 units Caps  Take 1,000 Units by mouth daily.     Vitamin K2 100 MCG Caps  Take 100 mcg by mouth daily.     Zinc Lozg  Take 1 lozenge by mouth daily as needed. supplement

## 2016-03-12 NOTE — Progress Notes (Signed)
Transported per [facility] NT

## 2016-03-13 ENCOUNTER — Encounter (HOSPITAL_COMMUNITY): Payer: Self-pay | Admitting: Neurological Surgery

## 2016-06-12 DIAGNOSIS — D252 Subserosal leiomyoma of uterus: Secondary | ICD-10-CM | POA: Insufficient documentation

## 2016-07-31 ENCOUNTER — Encounter (HOSPITAL_COMMUNITY)
Admission: EM | Disposition: A | Discharge status: Home / Self Care | Payer: Self-pay | Source: Home / Self Care | Attending: Physician Assistant

## 2016-07-31 ENCOUNTER — Emergency Department (HOSPITAL_COMMUNITY): Payer: BLUE CROSS/BLUE SHIELD | Admitting: Certified Registered Nurse Anesthetist

## 2016-07-31 ENCOUNTER — Observation Stay (HOSPITAL_COMMUNITY)
Admission: EM | Admit: 2016-07-31 | Discharge: 2016-08-01 | Disposition: A | Discharge status: Home / Self Care | Payer: BLUE CROSS/BLUE SHIELD | Attending: Orthopedic Surgery | Admitting: Orthopedic Surgery

## 2016-07-31 ENCOUNTER — Encounter (HOSPITAL_COMMUNITY): Payer: Self-pay | Admitting: Emergency Medicine

## 2016-07-31 ENCOUNTER — Emergency Department (HOSPITAL_COMMUNITY): Payer: BLUE CROSS/BLUE SHIELD

## 2016-07-31 DIAGNOSIS — S61412A Laceration without foreign body of left hand, initial encounter: Secondary | ICD-10-CM | POA: Diagnosis not present

## 2016-07-31 DIAGNOSIS — W06XXXA Fall from bed, initial encounter: Secondary | ICD-10-CM | POA: Diagnosis not present

## 2016-07-31 DIAGNOSIS — S62327B Displaced fracture of shaft of fifth metacarpal bone, left hand, initial encounter for open fracture: Principal | ICD-10-CM | POA: Insufficient documentation

## 2016-07-31 DIAGNOSIS — Z885 Allergy status to narcotic agent status: Secondary | ICD-10-CM | POA: Insufficient documentation

## 2016-07-31 DIAGNOSIS — Z888 Allergy status to other drugs, medicaments and biological substances status: Secondary | ICD-10-CM | POA: Diagnosis not present

## 2016-07-31 DIAGNOSIS — Y998 Other external cause status: Secondary | ICD-10-CM | POA: Insufficient documentation

## 2016-07-31 DIAGNOSIS — Z823 Family history of stroke: Secondary | ICD-10-CM | POA: Diagnosis not present

## 2016-07-31 DIAGNOSIS — Y93E9 Activity, other interior property and clothing maintenance: Secondary | ICD-10-CM | POA: Insufficient documentation

## 2016-07-31 DIAGNOSIS — Z8249 Family history of ischemic heart disease and other diseases of the circulatory system: Secondary | ICD-10-CM | POA: Insufficient documentation

## 2016-07-31 DIAGNOSIS — E039 Hypothyroidism, unspecified: Secondary | ICD-10-CM | POA: Diagnosis not present

## 2016-07-31 DIAGNOSIS — Y92092 Bedroom in other non-institutional residence as the place of occurrence of the external cause: Secondary | ICD-10-CM | POA: Insufficient documentation

## 2016-07-31 DIAGNOSIS — M85842 Other specified disorders of bone density and structure, left hand: Secondary | ICD-10-CM | POA: Diagnosis not present

## 2016-07-31 HISTORY — PX: CLOSED REDUCTION METACARPAL WITH PERCUTANEOUS PINNING: SHX5613

## 2016-07-31 HISTORY — PX: INCISION AND DRAINAGE: SHX5863

## 2016-07-31 LAB — CBC WITH DIFFERENTIAL/PLATELET
BASOS ABS: 0 10*3/uL (ref 0.0–0.1)
BASOS PCT: 0 %
Eosinophils Absolute: 0.2 10*3/uL (ref 0.0–0.7)
Eosinophils Relative: 2 %
HEMATOCRIT: 37.6 % (ref 36.0–46.0)
HEMOGLOBIN: 12.6 g/dL (ref 12.0–15.0)
Lymphocytes Relative: 27 %
Lymphs Abs: 1.8 10*3/uL (ref 0.7–4.0)
MCH: 32.1 pg (ref 26.0–34.0)
MCHC: 33.5 g/dL (ref 30.0–36.0)
MCV: 95.7 fL (ref 78.0–100.0)
MONOS PCT: 7 %
Monocytes Absolute: 0.5 10*3/uL (ref 0.1–1.0)
NEUTROS ABS: 4.1 10*3/uL (ref 1.7–7.7)
NEUTROS PCT: 64 %
Platelets: 281 10*3/uL (ref 150–400)
RBC: 3.93 MIL/uL (ref 3.87–5.11)
RDW: 13 % (ref 11.5–15.5)
WBC: 6.5 10*3/uL (ref 4.0–10.5)

## 2016-07-31 LAB — BASIC METABOLIC PANEL
ANION GAP: 8 (ref 5–15)
BUN: 14 mg/dL (ref 6–20)
CHLORIDE: 103 mmol/L (ref 101–111)
CO2: 25 mmol/L (ref 22–32)
Calcium: 8.3 mg/dL — ABNORMAL LOW (ref 8.9–10.3)
Creatinine, Ser: 0.58 mg/dL (ref 0.44–1.00)
GFR calc non Af Amer: >60 mL/min (ref >60)
Glucose, Bld: 96 mg/dL (ref 65–99)
POTASSIUM: 3.3 mmol/L — AB (ref 3.5–5.1)
Sodium: 136 mmol/L (ref 135–145)

## 2016-07-31 SURGERY — INCISION AND DRAINAGE
Anesthesia: General | Laterality: Left

## 2016-07-31 MED ORDER — LIDOCAINE HCL 2 % IJ SOLN
20.0000 mL | Freq: Once | INTRAMUSCULAR | Status: DC
  Filled 2016-07-31: qty 20

## 2016-07-31 MED ORDER — DOCUSATE SODIUM 100 MG PO CAPS
100.0000 mg | ORAL_CAPSULE | Freq: Two times a day (BID) | ORAL | Status: DC
  Administered 2016-08-01 (×2): 100 mg via ORAL
  Filled 2016-07-31 (×3): qty 1

## 2016-07-31 MED ORDER — METHOCARBAMOL 1000 MG/10ML IJ SOLN
500.0000 mg | Freq: Four times a day (QID) | INTRAVENOUS | Status: DC | PRN
  Administered 2016-08-01: 500 mg via INTRAVENOUS
  Filled 2016-07-31: qty 5
  Filled 2016-07-31: qty 550

## 2016-07-31 MED ORDER — OXYCODONE HCL 5 MG PO TABS
5.0000 mg | ORAL_TABLET | ORAL | Status: DC | PRN
  Administered 2016-08-01 (×2): 10 mg via ORAL
  Administered 2016-08-01 (×4): 5 mg via ORAL
  Filled 2016-07-31 (×4): qty 1
  Filled 2016-07-31 (×2): qty 2

## 2016-07-31 MED ORDER — CEFAZOLIN IN D5W 1 GM/50ML IV SOLN
1.0000 g | Freq: Once | INTRAVENOUS | Status: AC
  Administered 2016-07-31: 1 g via INTRAVENOUS
  Filled 2016-07-31: qty 50

## 2016-07-31 MED ORDER — FAMOTIDINE 20 MG PO TABS: 20.0000 mg | ORAL_TABLET | Freq: Two times a day (BID) | ORAL | Status: DC | PRN

## 2016-07-31 MED ORDER — MIDAZOLAM HCL 2 MG/2ML IJ SOLN
INTRAMUSCULAR | Status: AC
  Filled 2016-07-31: qty 2

## 2016-07-31 MED ORDER — VANCOMYCIN HCL IN DEXTROSE 1-5 GM/200ML-% IV SOLN
1000.0000 mg | Freq: Once | INTRAVENOUS | Status: AC
  Administered 2016-08-01: 1000 mg via INTRAVENOUS
  Filled 2016-07-31: qty 200

## 2016-07-31 MED ORDER — ONDANSETRON HCL 4 MG/2ML IJ SOLN
INTRAMUSCULAR | Status: AC
  Filled 2016-07-31: qty 2

## 2016-07-31 MED ORDER — ALPRAZOLAM 0.5 MG PO TABS
0.5000 mg | ORAL_TABLET | Freq: Four times a day (QID) | ORAL | Status: DC | PRN
  Administered 2016-08-01: 0.5 mg via ORAL
  Filled 2016-07-31: qty 1

## 2016-07-31 MED ORDER — FENTANYL CITRATE (PF) 250 MCG/5ML IJ SOLN
INTRAMUSCULAR | Status: AC
  Filled 2016-07-31: qty 5

## 2016-07-31 MED ORDER — FENTANYL CITRATE (PF) 100 MCG/2ML IJ SOLN
25.0000 ug | INTRAMUSCULAR | Status: DC | PRN
  Administered 2016-07-31 (×2): 50 ug via INTRAVENOUS

## 2016-07-31 MED ORDER — METHOCARBAMOL 500 MG PO TABS
500.0000 mg | ORAL_TABLET | Freq: Four times a day (QID) | ORAL | Status: DC | PRN
  Administered 2016-08-01: 500 mg via ORAL
  Filled 2016-07-31: qty 1

## 2016-07-31 MED ORDER — MORPHINE SULFATE (PF) 10 MG/ML IV SOLN: 1.0000 mg | INTRAVENOUS | Status: DC | PRN

## 2016-07-31 MED ORDER — FENTANYL CITRATE (PF) 100 MCG/2ML IJ SOLN
INTRAMUSCULAR | Status: AC
  Filled 2016-07-31: qty 2

## 2016-07-31 MED ORDER — PROMETHAZINE HCL 25 MG/ML IJ SOLN: 6.2500 mg | INTRAMUSCULAR | Status: DC | PRN

## 2016-07-31 MED ORDER — ONDANSETRON HCL 4 MG/2ML IJ SOLN: INTRAMUSCULAR | Status: DC | PRN

## 2016-07-31 MED ORDER — TETANUS-DIPHTH-ACELL PERTUSSIS 5-2.5-18.5 LF-MCG/0.5 IM SUSP
0.5000 mL | Freq: Once | INTRAMUSCULAR | Status: AC
  Administered 2016-07-31: 0.5 mL via INTRAMUSCULAR
  Filled 2016-07-31: qty 0.5

## 2016-07-31 MED ORDER — MIDAZOLAM HCL 5 MG/5ML IJ SOLN
INTRAMUSCULAR | Status: DC | PRN
  Administered 2016-07-31: 2 mg via INTRAVENOUS

## 2016-07-31 MED ORDER — LACTATED RINGERS IV SOLN
INTRAVENOUS | Status: DC | PRN
  Administered 2016-07-31: 22:00:00 via INTRAVENOUS

## 2016-07-31 MED ORDER — EPHEDRINE 5 MG/ML INJ
INTRAVENOUS | Status: AC
  Filled 2016-07-31: qty 10

## 2016-07-31 MED ORDER — HYDROMORPHONE HCL 1 MG/ML IJ SOLN
0.2500 mg | INTRAMUSCULAR | Status: DC | PRN
  Administered 2016-07-31 – 2016-08-01 (×4): 0.5 mg via INTRAVENOUS

## 2016-07-31 MED ORDER — SUCCINYLCHOLINE CHLORIDE 20 MG/ML IJ SOLN
INTRAMUSCULAR | Status: DC | PRN
  Administered 2016-07-31: 100 mg via INTRAVENOUS

## 2016-07-31 MED ORDER — LIDOCAINE 2% (20 MG/ML) 5 ML SYRINGE
INTRAMUSCULAR | Status: AC
  Filled 2016-07-31: qty 5

## 2016-07-31 MED ORDER — ONDANSETRON HCL 4 MG/2ML IJ SOLN
INTRAMUSCULAR | Status: DC | PRN
  Administered 2016-07-31: 4 mg via INTRAVENOUS

## 2016-07-31 MED ORDER — DEXAMETHASONE SODIUM PHOSPHATE 10 MG/ML IJ SOLN
INTRAMUSCULAR | Status: AC
  Filled 2016-07-31: qty 1

## 2016-07-31 MED ORDER — ONDANSETRON HCL 4 MG/2ML IJ SOLN
4.0000 mg | Freq: Once | INTRAMUSCULAR | Status: AC
  Administered 2016-07-31: 4 mg via INTRAVENOUS
  Filled 2016-07-31: qty 2

## 2016-07-31 MED ORDER — HYDROMORPHONE HCL 1 MG/ML IJ SOLN
INTRAMUSCULAR | Status: AC
  Filled 2016-07-31: qty 1

## 2016-07-31 MED ORDER — EPHEDRINE SULFATE 50 MG/ML IJ SOLN
INTRAMUSCULAR | Status: DC | PRN
  Administered 2016-07-31 (×2): 5 mg via INTRAVENOUS

## 2016-07-31 MED ORDER — MORPHINE SULFATE (PF) 4 MG/ML IV SOLN
4.0000 mg | Freq: Once | INTRAVENOUS | Status: AC
  Administered 2016-07-31: 4 mg via INTRAVENOUS
  Filled 2016-07-31: qty 1

## 2016-07-31 MED ORDER — FENTANYL CITRATE (PF) 100 MCG/2ML IJ SOLN
INTRAMUSCULAR | Status: DC | PRN
  Administered 2016-07-31 (×3): 50 ug via INTRAVENOUS
  Administered 2016-07-31: 100 ug via INTRAVENOUS

## 2016-07-31 MED ORDER — LIDOCAINE HCL (CARDIAC) 20 MG/ML IV SOLN
INTRAVENOUS | Status: DC | PRN
  Administered 2016-07-31: 100 mg via INTRAVENOUS

## 2016-07-31 MED ORDER — DEXAMETHASONE SODIUM PHOSPHATE 10 MG/ML IJ SOLN
INTRAMUSCULAR | Status: DC | PRN
  Administered 2016-07-31: 10 mg via INTRAVENOUS

## 2016-07-31 MED ORDER — HYDROCODONE-ACETAMINOPHEN 5-325 MG PO TABS: 1.0000 | ORAL_TABLET | Freq: Once | ORAL | Status: DC

## 2016-07-31 MED ORDER — VITAMIN C 500 MG PO TABS
1000.0000 mg | ORAL_TABLET | Freq: Every day | ORAL | Status: DC
  Administered 2016-08-01: 1000 mg via ORAL
  Filled 2016-07-31: qty 2

## 2016-07-31 MED ORDER — PROPOFOL 10 MG/ML IV BOLUS
INTRAVENOUS | Status: AC
  Filled 2016-07-31: qty 20

## 2016-07-31 MED ORDER — PROPOFOL 10 MG/ML IV BOLUS
INTRAVENOUS | Status: DC | PRN
  Administered 2016-07-31: 150 mg via INTRAVENOUS

## 2016-07-31 SURGICAL SUPPLY — 33 items
BANDAGE ACE 4X5 VEL STRL LF (GAUZE/BANDAGES/DRESSINGS) ×2 IMPLANT
BNDG GAUZE ELAST 4 BULKY (GAUZE/BANDAGES/DRESSINGS) ×2 IMPLANT
CUFF TOURN SGL QUICK 18 (TOURNIQUET CUFF) ×3 IMPLANT
DRAIN PENROSE 18X1/4 LTX STRL (WOUND CARE) IMPLANT
DRSG EMULSION OIL 3X3 NADH (GAUZE/BANDAGES/DRESSINGS) ×2 IMPLANT
DRSG PAD ABDOMINAL 8X10 ST (GAUZE/BANDAGES/DRESSINGS) IMPLANT
ELECT REM PT RETURN 9FT ADLT (ELECTROSURGICAL) ×3
ELECTRODE REM PT RTRN 9FT ADLT (ELECTROSURGICAL) ×1 IMPLANT
GAUZE SPONGE 4X4 12PLY STRL (GAUZE/BANDAGES/DRESSINGS) ×2 IMPLANT
GAUZE XEROFORM 1X8 LF (GAUZE/BANDAGES/DRESSINGS) ×2 IMPLANT
GLOVE BIO SURGEON STRL SZ8 (GLOVE) ×3 IMPLANT
GOWN STRL REUS W/TWL XL LVL3 (GOWN DISPOSABLE) ×3 IMPLANT
K-WIRE DBL TROCAR .045X4 ×3 IMPLANT
KIT BASIN OR (CUSTOM PROCEDURE TRAY) ×3 IMPLANT
KWIRE DBL TROCAR .045X4 IMPLANT
LOOP VESSEL MAXI BLUE (MISCELLANEOUS) ×3 IMPLANT
MANIFOLD NEPTUNE II (INSTRUMENTS) ×3 IMPLANT
PACK ORTHO EXTREMITY (CUSTOM PROCEDURE TRAY) ×3 IMPLANT
PADDING CAST ABS 4INX4YD NS (CAST SUPPLIES) ×2
PADDING CAST ABS COTTON 4X4 ST (CAST SUPPLIES) IMPLANT
POSITIONER SURGICAL ARM (MISCELLANEOUS) ×3 IMPLANT
SOL PREP POV-IOD 4OZ 10% (MISCELLANEOUS) ×3 IMPLANT
SOL PREP PROV IODINE SCRUB 4OZ (MISCELLANEOUS) ×3 IMPLANT
SUT PROLENE 3 0 PS 2 (SUTURE) IMPLANT
SUT VIC AB 1 CT1 27 (SUTURE)
SUT VIC AB 1 CT1 27XBRD ANTBC (SUTURE) IMPLANT
SUT VIC AB 2-0 CT1 27 (SUTURE)
SUT VIC AB 2-0 CT1 27XBRD (SUTURE) IMPLANT
SWAB COLLECTION DEVICE MRSA (MISCELLANEOUS) IMPLANT
SWAB CULTURE ESWAB REG 1ML (MISCELLANEOUS) IMPLANT
SYR 20CC LL (SYRINGE) ×3 IMPLANT
SYR CONTROL 10ML LL (SYRINGE) IMPLANT
TOWEL OR 17X26 10 PK STRL BLUE (TOWEL DISPOSABLE) ×3 IMPLANT

## 2016-07-31 NOTE — Transfer of Care (Signed)
Immediate Anesthesia Transfer of Care Note  Patient: Michelle Mann  Procedure(s) Performed: Procedure(s): INCISION AND DRAINAGE (Left) CLOSED REDUCTION METACARPAL WITH PERCUTANEOUS PINNING (Left)  Patient Location: PACU  Anesthesia Type:General  Level of Consciousness:  sedated, patient cooperative and responds to stimulation  Airway & Oxygen Therapy:Patient Spontanous Breathing and Patient connected to face mask oxgen  Post-op Assessment:  Report given to PACU RN and Post -op Vital signs reviewed and stable  Post vital signs:  Reviewed and stable  Last Vitals:  Vitals:   07/31/16 1933  BP: 120/94  Pulse: 100  Resp: 22  Temp: 123456 C    Complications: No apparent anesthesia complications

## 2016-07-31 NOTE — ED Triage Notes (Signed)
Pt states she was on the bed changing the light in the ceiling fan and fell off  Pt states she landed on her left side and slid across the floor  Pt states her left hand went under the chest of drawers and hit the leg  Pt has a laceration between her index and pinky finger that extends down into the palm area  Bleeding not controlled Pt has a small laceration on her right elbow

## 2016-07-31 NOTE — Op Note (Signed)
See dict [id number] Amedeo Plenty MD

## 2016-07-31 NOTE — Anesthesia Procedure Notes (Signed)
Procedure Name: Intubation Date/Time: 07/31/2016 10:15 PM Performed by: Maxwell Caul Pre-anesthesia Checklist: Patient identified, Emergency Drugs available, Suction available and Patient being monitored Patient Re-evaluated:Patient Re-evaluated prior to inductionOxygen Delivery Method: Circle system utilized Preoxygenation: Pre-oxygenation with 100% oxygen Intubation Type: IV induction, Rapid sequence and Cricoid Pressure applied Laryngoscope Size: Mac and 4 Grade View: Grade I Tube type: Oral Tube size: 7.0 mm Number of attempts: 1 Airway Equipment and Method: Stylet and Oral airway Placement Confirmation: ETT inserted through vocal cords under direct vision,  positive ETCO2 and breath sounds checked- equal and bilateral Secured at: 21 cm Tube secured with: Tape Dental Injury: Teeth and Oropharynx as per pre-operative assessment

## 2016-07-31 NOTE — ED Notes (Signed)
Called OR for hand surgery tray

## 2016-07-31 NOTE — ED Provider Notes (Signed)
[facility] DEPT Provider Note   CSN: [id number] Arrival date & time: 07/31/16  1926  By signing my name below, I, Soijett Blue, attest that this documentation has been prepared under the direction and in the presence of Enrica Corliss, PA-C Electronically Signed: Soijett Blue, ED Scribe. 07/31/16. 8:23 PM.   History   Chief Complaint Chief Complaint  Patient presents with  . Fall  . Hand Injury    HPI Michelle Mann is a 45 y.o. female with a PMHx of HTN, hyperthyroidism, who presents to the Emergency Department complaining of a fall onset PTA. Pt notes that she was on her bed changing a ceiling light bulb when she accidentally fell off the bed, catching her left small finger on a dresser as she fell. Pt is having associated symptoms of 8-9/10, throbbing, left hand pain and laceration between left 4th and 5th fingers. She notes that she has tried applying pressure, but has not taken any medications for the relief of her symptoms. She denies head injury, LOC, neck/back pain, neuro deficits, or any other complaints.   Pt notes that she is right hand dominant. Pt reports that she is unsure of the status of her tetanus vaccination. Last food 2 hours ago.   The history is provided by the patient. No language interpreter was used.    Past Medical History:  Diagnosis Date  . Complication of anesthesia    woke up during neuroma surgery  . Constipation   . Hypertension    none since weight loss  . Hypothyroidism   . Pneumonia   . Raised serum iron     Patient Active Problem List   Diagnosis Date Noted  . Displaced fracture of shaft of fifth metacarpal bone, left hand, initial encounter for open fracture 07/31/2016  . Herniated nucleus pulposis of lumbosacral region 03/12/2016    Past Surgical History:  Procedure Laterality Date  . CLOSED REDUCTION METACARPAL WITH PERCUTANEOUS PINNING Left 07/31/2016   Procedure: CLOSED REDUCTION METACARPAL WITH PERCUTANEOUS PINNING;  Surgeon:  Roseanne Kaufman, MD;  Location: [facility];  Service: Orthopedics;  Laterality: Left;  . COLONOSCOPY    . EXCISION MORTON'S NEUROMA Left    foot  . INCISION AND DRAINAGE Left 07/31/2016   Procedure: INCISION AND DRAINAGE;  Surgeon: Roseanne Kaufman, MD;  Location: [facility];  Service: Orthopedics;  Laterality: Left;  . LUMBAR LAMINECTOMY/DECOMPRESSION MICRODISCECTOMY Right 03/12/2016   Procedure: Right Lumbar five-dacral-one Microdiskectomy;  Surgeon: Kevan Ny Ditty, MD;  Location: [place] [facility];  Service: Neurosurgery;  Laterality: Right;  right  . WISDOM TOOTH EXTRACTION  1986    OB History    No data available       Home Medications    Prior to Admission medications   Medication Sig Start Date End Date Taking? Authorizing Provider  ALOE PO Take 1 capsule by mouth daily.   Yes Historical Provider, MD  Ascorbic Acid (VITAMIN C) 500 MG CAPS Take 500 mg by mouth daily.   Yes Historical Provider, MD  Barberry-Oreg Grape-Goldenseal 200-200-50 MG CAPS Take 1 tablet by mouth daily.   Yes Historical Provider, MD  Biotin 1 MG CAPS Take 1 mg by mouth daily.   Yes Historical Provider, MD  Cholecalciferol (VITAMIN D3) 1000 units CAPS Take 1,000 Units by mouth daily.   Yes Historical Provider, MD  diclofenac (VOLTAREN) 75 MG EC tablet Take 75 mg by mouth daily at 12 noon. 07/12/16  Yes Historical Provider, MD  gabapentin (NEURONTIN) 300 MG capsule Take 1 capsule (  300 mg total) by mouth 3 (three) times daily. Patient taking differently: Take 300 mg by mouth 2 (two) times daily.  03/12/16  Yes Kevan Ny Ditty, MD  hydrochlorothiazide (MICROZIDE) 12.5 MG capsule Take 12.5 mg by mouth daily.   Yes Historical Provider, MD  MAGNESIUM GLYCINATE PLUS PO Take 1 tablet by mouth daily. In addition to mg sulfate   Yes Historical Provider, MD  Magnesium Sulfate 70 MG CAPS Take 70 mg by mouth daily.   Yes Historical Provider, MD  Menaquinone-7 (VITAMIN K2) 100 MCG CAPS Take 100 mcg by mouth daily.   Yes  Historical Provider, MD  methocarbamol (ROBAXIN) 750 MG tablet Take 1 tablet (750 mg total) by mouth 4 (four) times daily. Patient taking differently: Take 750 mg by mouth 2 (two) times daily.  03/12/16  Yes Kevan Ny Ditty, MD  Misc Natural Products (ADRENAL PO) Take 1 tablet by mouth daily.   Yes Historical Provider, MD  Misc Natural Products (GERMANIUM) 10 MG CAPS Take 10 mg by mouth daily.   Yes Historical Provider, MD  Multiple Vitamins-Minerals (ZINC) LOZG Take 1 lozenge by mouth daily as needed. supplement   Yes Historical Provider, MD  NONFORMULARY OR COMPOUNDED ITEM Take 1 capsule by mouth daily. *Liothyronine Sodium T3SR*   Yes Historical Provider, MD  Omega-3 Fatty Acids (FISH OIL) 1000 MG CAPS Take 1,000 mg by mouth daily.   Yes Historical Provider, MD  pyridOXINE (VITAMIN B-6) 25 MG tablet Take 25 mg by mouth daily.   Yes Historical Provider, MD  Testosterone (TESTOPEL IL) by Implant route every 4 (four) months. Pellet.   Yes Historical Provider, MD  Thyroid 162.5 MG TABS Take 162.5 mg by mouth daily. Nature Thyroid   Yes Historical Provider, MD  HYDROcodone-acetaminophen (NORCO) 5-325 MG tablet Take 1 tablet by mouth every 4 (four) hours as needed for moderate pain. 08/01/16   Avelina Laine, PA-C  sulfamethoxazole-trimethoprim (BACTRIM DS,SEPTRA DS) 800-160 MG tablet Take 1 tablet by mouth 2 (two) times daily. 08/01/16   Avelina Laine, PA-C    Family History Family History  Problem Relation Age of Onset  . Stroke Mother   . Heart disease Mother   . Hypertension Mother   . Other Father     polio    Social History Social History  Substance Use Topics  . Smoking status: Never Smoker  . Smokeless tobacco: Never Used  . Alcohol use Yes     Comment: socially     Allergies   Other; Vantin [cefpodoxime]; and Codeine   Review of Systems Review of Systems  Musculoskeletal: Positive for arthralgias (left hand). Negative for back pain and joint swelling.  Skin:  Positive for wound (laceration between left 4th and 5th fingers).  Neurological: Negative for syncope.  All other systems reviewed and are negative.    Physical Exam Updated Vital Signs BP 120/94   Pulse 100   Temp 98.4 F (36.9 C) (Oral)   Resp 22   LMP 07/25/2016 (Exact Date)   SpO2 100%   Physical Exam  Constitutional: She appears well-developed and well-nourished. No distress.  HENT:  Head: Normocephalic and atraumatic.  Eyes: Conjunctivae and EOM are normal. Pupils are equal, round, and reactive to light.  Neck: Normal range of motion. Neck supple.  Cardiovascular: Normal rate, regular rhythm, normal heart sounds and intact distal pulses.   Pulmonary/Chest: Effort normal and breath sounds normal. No respiratory distress.  Abdominal: Soft. There is no tenderness. There is no guarding.  Musculoskeletal: She exhibits  no edema.  Motor function intact to the fingers of the left hand. Normal motor function intact in all other extremities and spine. No midline spinal tenderness.   Lymphadenopathy:    She has no cervical adenopathy.  Neurological: She is alert. She has normal strength.  Able to flex and extend left 5th digit. Able to spread and contract fingers of left hand against mild resistance. No noted sensory deficits.  Skin: Skin is warm and dry. Capillary refill takes less than 2 seconds. Laceration noted. She is not diaphoretic.  6 cm full thickness laceration between 4th and 5th digits of left hand from the dorsum to the palm.   Psychiatric: She has a normal mood and affect. Her behavior is normal.  Nursing note and vitals reviewed.          ED Treatments / Results  DIAGNOSTIC STUDIES: Oxygen Saturation is 100% on RA, nl by my interpretation.    COORDINATION OF CARE: 8:20 PM Discussed treatment plan with pt at bedside which includes left hand xray, update tetanus vaccination, morphine, zofran, consult with hand surgeon, labs, and pt agreed to plan.      Labs (all labs ordered are listed, but only abnormal results are displayed) Labs Reviewed  BASIC METABOLIC PANEL - Abnormal; Notable for the following:       Result Value   Potassium 3.3 (*)    Calcium 8.3 (*)    All other components within normal limits  CBC WITH DIFFERENTIAL/PLATELET    EKG  EKG Interpretation None       Radiology Dg Hand Complete Left  Result Date: 07/31/2016 CLINICAL DATA:  [place] while changing light bulb, landing on LEFT hand. EXAM: LEFT HAND - COMPLETE 3+ VIEW COMPARISON:  None. FINDINGS: Acute mildly comminuted distal fifth metacarpus fracture with palmar angulation distal bony fragments. No intra-articular extension. No dislocation. No destructive bony lesions. Palmar bandage without subcutaneous gas or radiopaque foreign bodies. IMPRESSION: Acute displaced fifth metacarpus fracture.  No dislocation. Electronically Signed   By: Elon Alas M.D.   On: 07/31/2016 20:02    Procedures Procedures (including critical care time)  Medications Ordered in ED Medications  fentaNYL (SUBLIMAZE) 100 MCG/2ML injection (not administered)  HYDROmorphone (DILAUDID) 1 MG/ML injection (not administered)  HYDROmorphone (DILAUDID) 1 MG/ML injection (not administered)  Tdap (BOOSTRIX) injection 0.5 mL (0.5 mLs Intramuscular Given 07/31/16 2040)  morphine 4 MG/ML injection 4 mg (4 mg Intravenous Given 07/31/16 2038)  ondansetron (ZOFRAN) injection 4 mg (4 mg Intravenous Given 07/31/16 2037)  ceFAZolin (ANCEF) IVPB 1 g/50 mL premix (1 g Intravenous New Bag/Given 07/31/16 2118)  vancomycin (VANCOCIN) IVPB 1000 mg/200 mL premix (1,000 mg Intravenous Given 08/01/16 0024)     Initial Impression / Assessment and Plan / ED Course  I have reviewed the triage vital signs and the nursing notes.  Pertinent labs & imaging results that were available during my care of the patient were reviewed by me and considered in my medical decision making (see chart for  details).  Clinical Course     Patient presents with left hand injury that occurred just prior to arrival. Fifth metacarpal fracture with overlying deep laceration. Hand surgery consult. 8:38 PM- Consult with hand surgeon Dr. Amedeo Plenty, who requests a hand tray, lidocaine, 1 gram of ancef, and notes that he will be coming into the ED to evaluate the patient.  Dr. Amedeo Plenty evaluated the patient in the ED and took her to the OR for repair.   Vitals:   08/01/16 0100  08/01/16 0638 08/01/16 1029 08/01/16 1535  BP:  (!) 101/54 107/61 108/60  Pulse:  91 96 84  Resp:  14    Temp:  97.8 F (36.6 C) 98.3 F (36.8 C) 98.5 F (36.9 C)  TempSrc:  Oral  Oral  SpO2:  98% 98% 100%  Weight: 74.4 kg     Height: 5\' 4"  (1.626 m)        Final Clinical Impressions(s) / ED Diagnoses   Final diagnoses:  Open displaced fracture of shaft of fifth metacarpal bone of left hand, initial encounter    New Prescriptions  I personally performed the services described in this documentation, which was scribed in my presence. The recorded information has been reviewed and is accurate.    Lorayne Bender, PA-C 08/02/16 [place], MD 08/03/16 1334

## 2016-07-31 NOTE — Anesthesia Postprocedure Evaluation (Signed)
Anesthesia Post Note  Patient: Michelle Mann  Procedure(s) Performed: Procedure(s) (LRB): INCISION AND DRAINAGE (Left) CLOSED REDUCTION METACARPAL WITH PERCUTANEOUS PINNING (Left)  Patient location during evaluation: PACU Anesthesia Type: General Level of consciousness: awake and alert Pain management: pain level controlled Vital Signs Assessment: post-procedure vital signs reviewed and stable Respiratory status: spontaneous breathing, nonlabored ventilation, respiratory function stable and patient connected to nasal cannula oxygen Cardiovascular status: blood pressure returned to baseline and stable Postop Assessment: no signs of nausea or vomiting Anesthetic complications: no    Last Vitals:  Vitals:   07/31/16 1933  BP: 120/94  Pulse: 100  Resp: 22  Temp: 36.9 C    Last Pain:  Vitals:   07/31/16 2039  TempSrc:   PainSc: 10-Worst pain ever                 Catalina Gravel

## 2016-07-31 NOTE — H&P (Signed)
Michelle Mann is an 45 y.o. female.   Chief Complaint: Left hand fracture open in nature with soft tissue laceration HPI: Patient presents with an open fracture left hand fifth metacarpal and associated soft tissue injury. She has difficulty with flexion and extension. She had back surgery this summer. She denies other injury. She denies new back complaints.  She denies neck back chest or abdominal pain at current  Past Medical History:  Diagnosis Date  . Complication of anesthesia    woke up during neuroma surgery  . Constipation   . Hypertension    none since weight loss  . Hypothyroidism   . Pneumonia   . Raised serum iron     Past Surgical History:  Procedure Laterality Date  . COLONOSCOPY    . EXCISION MORTON'S NEUROMA Left    foot  . LUMBAR LAMINECTOMY/DECOMPRESSION MICRODISCECTOMY Right 03/12/2016   Procedure: Right Lumbar five-dacral-one Microdiskectomy;  Surgeon: Kevan Ny Ditty, MD;  Location: [place];  Service: Neurosurgery;  Laterality: Right;  right  . WISDOM TOOTH EXTRACTION  1986    Family History  Problem Relation Age of Onset  . Stroke Mother   . Heart disease Mother   . Hypertension Mother   . Other Father     polio   Social History:  reports that she has never smoked. She has never used smokeless tobacco. She reports that she drinks alcohol. She reports that she does not use drugs.  Allergies:  Allergies  Allergen Reactions  . Other Swelling    *Avacodo* swelling of the abdomen   . Vantin [Cefpodoxime]     Unknown reaction   . Codeine Other (See Comments)    AGITATION     (Not in a hospital admission)  Results for orders placed or performed during the hospital encounter of 07/31/16 (from the past 48 hour(s))  CBC with Differential     Status: None   Collection Time: 07/31/16  8:53 PM  Result Value Ref Range   WBC 6.5 4.0 - 10.5 K/uL   RBC 3.93 3.87 - 5.11 MIL/uL   Hemoglobin 12.6 12.0 - 15.0 g/dL   HCT 37.6 36.0 - 46.0 %   MCV  95.7 78.0 - 100.0 fL   MCH 32.1 26.0 - 34.0 pg   MCHC 33.5 30.0 - 36.0 g/dL   RDW 13.0 11.5 - 15.5 %   Platelets 281 150 - 400 K/uL   Neutrophils Relative % 64 %   Neutro Abs 4.1 1.7 - 7.7 K/uL   Lymphocytes Relative 27 %   Lymphs Abs 1.8 0.7 - 4.0 K/uL   Monocytes Relative 7 %   Monocytes Absolute 0.5 0.1 - 1.0 K/uL   Eosinophils Relative 2 %   Eosinophils Absolute 0.2 0.0 - 0.7 K/uL   Basophils Relative 0 %   Basophils Absolute 0.0 0.0 - 0.1 K/uL   Dg Hand Complete Left  Result Date: 07/31/2016 CLINICAL DATA:  [place] while changing light bulb, landing on LEFT hand. EXAM: LEFT HAND - COMPLETE 3+ VIEW COMPARISON:  None. FINDINGS: Acute mildly comminuted distal fifth metacarpus fracture with palmar angulation distal bony fragments. No intra-articular extension. No dislocation. No destructive bony lesions. Palmar bandage without subcutaneous gas or radiopaque foreign bodies. IMPRESSION: Acute displaced fifth metacarpus fracture.  No dislocation. Electronically Signed   By: Elon Alas M.D.   On: 07/31/2016 20:02    Review of Systems  HENT: Negative.   Eyes: Negative.   Respiratory: Negative.   Cardiovascular: Negative.  Gastrointestinal: Negative.   Genitourinary: Negative.   Skin: Negative.     Blood pressure 120/94, pulse 100, temperature 98.4 F (36.9 C), temperature source Oral, resp. rate 22, last menstrual period 07/25/2016, SpO2 100 %. Physical Exam  Open left hand fracture. This is a fifth metacarpal fracture significantly displaced with open injury to the area. There is a large soft tissue laceration and avulsion component  The patient is alert and oriented in no acute distress. The patient complains of pain in the affected upper extremity.  The patient is noted to have a normal HEENT exam. Lung fields show equal chest expansion and no shortness of breath. Abdomen exam is nontender without distention. Lower extremity examination does not show any fracture  dislocation or blood clot symptoms. Pelvis is stable and the neck and back are stable and nontender. Assessment/Plan Open left fifth metacarpal fracture with soft tissue avulsion  We'll plan for irrigation debridement repair of tendon bone and associated soft tissue structures as necessary.  We are planning surgery for your upper extremity. The risk and benefits of surgery to include risk of bleeding, infection, anesthesia,  damage to normal structures and failure of the surgery to accomplish its intended goals of relieving symptoms and restoring function have been discussed in detail. With this in mind we plan to proceed. I have specifically discussed with the patient the pre-and postoperative regime and the dos and don'ts and risk and benefits in great detail. Risk and benefits of surgery also include risk of dystrophy(CRPS), chronic nerve pain, failure of the healing process to go onto completion and other inherent risks of surgery The relavent the pathophysiology of the disease/injury process, as well as the alternatives for treatment and postoperative course of action has been discussed in great detail with the patient who desires to proceed.  We will do everything in our power to help you (the patient) restore function to the upper extremity. It is a pleasure to see this patient today.   Paulene Floor, MD 07/31/2016, 9:16 PM

## 2016-07-31 NOTE — Anesthesia Preprocedure Evaluation (Addendum)
Anesthesia Evaluation  Patient identified by MRN, date of birth, ID band Patient awake    Reviewed: Allergy & Precautions, NPO status , Patient's Chart, lab work & pertinent test results  History of Anesthesia Complications (+) history of anesthetic complications ("woke up during neuroma surgery")  Airway Mallampati: I  TM Distance: >3 FB Neck ROM: Full    Dental  (+) Teeth Intact, Dental Advisory Given   Pulmonary neg pulmonary ROS,    Pulmonary exam normal breath sounds clear to auscultation       Cardiovascular Exercise Tolerance: Good hypertension, negative cardio ROS Normal cardiovascular exam Rhythm:Regular Rate:Normal     Neuro/Psych negative neurological ROS  negative psych ROS   GI/Hepatic negative GI ROS, Neg liver ROS,   Endo/Other  Hypothyroidism   Renal/GU negative Renal ROS     Musculoskeletal negative musculoskeletal ROS (+)   Abdominal   Peds  Hematology negative hematology ROS (+)   Anesthesia Other Findings Day of surgery medications reviewed with the patient.  Reproductive/Obstetrics negative OB ROS                            Anesthesia Physical Anesthesia Plan  ASA: II and emergent  Anesthesia Plan: General   Post-op Pain Management:    Induction: Intravenous, Rapid sequence and Cricoid pressure planned  Airway Management Planned: Oral ETT  Additional Equipment:   Intra-op Plan:   Post-operative Plan: Extubation in OR  Informed Consent: I have reviewed the patients History and Physical, chart, labs and discussed the procedure including the risks, benefits and alternatives for the proposed anesthesia with the patient or authorized representative who has indicated his/her understanding and acceptance.   Dental advisory given  Plan Discussed with: CRNA  Anesthesia Plan Comments: (Risks/benefits of general anesthesia discussed with patient including risk  of damage to teeth, lips, gum, and tongue, nausea/vomiting, allergic reactions to medications, and the possibility of heart attack, stroke and death.  All patient questions answered.  Patient wishes to proceed.  Surgeon called emergent surgery.  Patient not NPO for 8 hours.  Will proceed as emergency and perform RSI/ETT.)        Anesthesia Quick Evaluation

## 2016-07-31 NOTE — ED Notes (Signed)
Consent form filled with surgeon present.  Dispo: surgery

## 2016-08-01 ENCOUNTER — Encounter (HOSPITAL_COMMUNITY): Payer: Self-pay | Admitting: Orthopedic Surgery

## 2016-08-01 MED ORDER — HYDROMORPHONE HCL 1 MG/ML IJ SOLN
INTRAMUSCULAR | Status: AC
  Filled 2016-08-01: qty 1

## 2016-08-01 MED ORDER — VANCOMYCIN HCL IN DEXTROSE 750-5 MG/150ML-% IV SOLN
750.0000 mg | Freq: Three times a day (TID) | INTRAVENOUS | Status: DC
  Administered 2016-08-01 (×2): 750 mg via INTRAVENOUS
  Filled 2016-08-01 (×2): qty 150

## 2016-08-01 MED ORDER — HYDROCODONE-ACETAMINOPHEN 5-325 MG PO TABS: 1.0000 | ORAL_TABLET | ORAL | 0 refills | Status: DC | PRN

## 2016-08-01 MED ORDER — THYROID 162.5 MG PO TABS: 162.5000 mg | ORAL_TABLET | Freq: Every day | ORAL | Status: DC

## 2016-08-01 MED ORDER — LACTATED RINGERS IV SOLN
INTRAVENOUS | Status: DC
  Administered 2016-08-01 (×2): via INTRAVENOUS

## 2016-08-01 MED ORDER — SULFAMETHOXAZOLE-TRIMETHOPRIM 800-160 MG PO TABS: 1.0000 | ORAL_TABLET | Freq: Two times a day (BID) | ORAL | 0 refills | Status: DC

## 2016-08-01 MED ORDER — HYDROCHLOROTHIAZIDE 12.5 MG PO CAPS
12.5000 mg | ORAL_CAPSULE | Freq: Every day | ORAL | Status: DC
  Filled 2016-08-01: qty 1

## 2016-08-01 MED ORDER — MORPHINE SULFATE (PF) 2 MG/ML IV SOLN: 1.0000 mg | INTRAVENOUS | Status: DC | PRN

## 2016-08-01 MED ORDER — GABAPENTIN 300 MG PO CAPS: 300.0000 mg | ORAL_CAPSULE | Freq: Three times a day (TID) | ORAL | Status: DC

## 2016-08-01 NOTE — Discharge Instructions (Signed)

## 2016-08-01 NOTE — Discharge Summary (Signed)
Physician Discharge Summary  Patient ID: Michelle Mann MRN: [id number] DOB/AGE: May 02, 1971 45 y.o.  Admit date: 07/31/2016 Discharge date:   Admission Diagnoses: open left fifth metacarpal fracture Past Medical History:  Diagnosis Date  . Complication of anesthesia    woke up during neuroma surgery  . Constipation   . Hypertension    none since weight loss  . Hypothyroidism   . Pneumonia   . Raised serum iron     Discharge Diagnoses:  Active Problems:   Displaced fracture of shaft of fifth metacarpal bone, left hand, initial encounter for open fracture   Surgeries: Procedure(s): INCISION AND DRAINAGE CLOSED REDUCTION METACARPAL WITH PERCUTANEOUS PINNING on 07/31/2016 - 08/01/2016    Consultants:  none Discharged Condition: Improved  Hospital Course: Michelle Mann is an 45 y.o. female who was admitted 07/31/2016 with a chief complaint of Chief Complaint  Patient presents with  . Fall  . Hand Injury  , and found to have a diagnosis of open left fifth metacarpal fracture.  They were brought to the operating room on 07/31/2016 - 08/01/2016 and underwent Procedure(s): INCISION AND DRAINAGE CLOSED REDUCTION METACARPAL WITH PERCUTANEOUS PINNING.    They were given perioperative antibiotics: Anti-infectives    Start     Dose/Rate Route Frequency Ordered Stop   08/01/16 0800  vancomycin (VANCOCIN) IVPB 750 mg/150 ml premix     750 mg 150 mL/hr over 60 Minutes Intravenous Every 8 hours 08/01/16 0444     08/01/16 0000  sulfamethoxazole-trimethoprim (BACTRIM DS,SEPTRA DS) 800-160 MG tablet     1 tablet Oral 2 times daily 08/01/16 0859     07/31/16 2345  vancomycin (VANCOCIN) IVPB 1000 mg/200 mL premix     1,000 mg 200 mL/hr over 60 Minutes Intravenous  Once 07/31/16 2337 08/01/16 0124   07/31/16 2045  ceFAZolin (ANCEF) IVPB 1 g/50 mL premix     1 g 100 mL/hr over 30 Minutes Intravenous  Once 07/31/16 2044 07/31/16 2148    .  They were given sequential compression  devices, early ambulation for DVT prophylaxis.  Recent vital signs: Patient Vitals for the past 24 hrs:  BP Temp Temp src Pulse Resp SpO2 Height Weight  08/01/16 0638 (!) 101/54 97.8 F (36.6 C) Oral 91 14 98 % - -  08/01/16 0100 - - - - - - 5\' 4"  (1.626 m) 74.4 kg (164 lb)  08/01/16 0059 132/74 97.7 F (36.5 C) - 85 - 98 % - -  08/01/16 0039 125/76 97.5 F (36.4 C) - (!) 115 (!) 29 100 % - -  08/01/16 0015 134/83 - - 100 12 100 % - -  08/01/16 0000 125/82 - - 100 14 98 % - -  07/31/16 2345 124/80 - - (!) 106 10 98 % - -  07/31/16 2332 136/86 - - (!) 112 17 100 % - -  07/31/16 2315 130/64 97.7 F (36.5 C) - (!) 119 13 100 % - -  07/31/16 1933 120/94 98.4 F (36.9 C) Oral 100 22 100 % - -  .  Recent laboratory studies: Dg Hand Complete Left  Result Date: 07/31/2016 CLINICAL DATA:  [place] while changing light bulb, landing on LEFT hand. EXAM: LEFT HAND - COMPLETE 3+ VIEW COMPARISON:  None. FINDINGS: Acute mildly comminuted distal fifth metacarpus fracture with palmar angulation distal bony fragments. No intra-articular extension. No dislocation. No destructive bony lesions. Palmar bandage without subcutaneous gas or radiopaque foreign bodies. IMPRESSION: Acute displaced fifth metacarpus fracture.  No dislocation. Electronically  Signed   By: Elon Alas M.D.   On: 07/31/2016 20:02    Discharge Medications:    Diagnostic Studies: Dg Hand Complete Left  Result Date: 07/31/2016 CLINICAL DATA:  [place] while changing light bulb, landing on LEFT hand. EXAM: LEFT HAND - COMPLETE 3+ VIEW COMPARISON:  None. FINDINGS: Acute mildly comminuted distal fifth metacarpus fracture with palmar angulation distal bony fragments. No intra-articular extension. No dislocation. No destructive bony lesions. Palmar bandage without subcutaneous gas or radiopaque foreign bodies. IMPRESSION: Acute displaced fifth metacarpus fracture.  No dislocation. Electronically Signed   By: Elon Alas M.D.   On:  07/31/2016 20:02    They benefited maximally from their hospital stay and there were no complications.     Disposition: 01-Home or Self Care Discharge Instructions    Call MD / Call 911    Complete by:  As directed    If you experience chest pain or shortness of breath, CALL 911 and be transported to the hospital emergency room.  If you develope a fever above 101 F, pus (white drainage) or increased drainage or redness at the wound, or calf pain, call your surgeon's office.   Constipation Prevention    Complete by:  As directed    Drink plenty of fluids.  Prune juice may be helpful.  You may use a stool softener, such as Colace (over the counter) 100 mg twice a day.  Use MiraLax (over the counter) for constipation as needed.   Diet - low sodium heart healthy    Complete by:  As directed    Discharge instructions    Complete by:  As directed    Keep bandage clean and dry.  Call for any problems.  No smoking.  Criteria for driving a car: you should be off your pain medicine for 7-8 hours, able to drive one handed(confident), thinking clearly and feeling able in your judgement to drive. Continue elevation as it will decrease swelling.  If instructed by MD move your fingers within the confines of the bandage/splint.  Use ice if instructed by your MD. Call immediately for any sudden loss of feeling in your hand/arm or change in functional abilities of the extremity. We recommend that you to take vitamin C 1000 mg a day to promote healing. We also recommend that if you require  pain medicine that you take a stool softener to prevent constipation as most pain medicines will have constipation side effects. We recommend either Peri-Colace or Senokot and recommend that you also consider adding MiraLAX as well to prevent the constipation affects from pain medicine if you are required to use them. These medicines are over the counter and may be purchased at a local pharmacy. A cup of yogurt and a probiotic  can also be helpful during the recovery process as the medicines can disrupt your intestinal environment.   Increase activity slowly as tolerated    Complete by:  As directed      Follow-up Information    Paulene Floor, MD. Schedule an appointment as soon as possible for a visit in 12 day(s).   Specialty:  Orthopedic Surgery Why:  our office will contact you with appt date and time, please call for questions or concerns Contact information: [address] [place] [address] [phone]            Signed: Ivan Croft 08/01/2016, 9:01 AM

## 2016-08-01 NOTE — Progress Notes (Signed)
Pharmacy Antibiotic Note  ONEY RINGSTAD is a 45 y.o. female admitted on 07/31/2016 with Left hand fracture open in nature with soft tissue laceration.  Pharmacy has been consulted for Vancomycin dosing.  Plan: Vancomycin 750mg  IV every 8 hours.  Goal trough 15-20 mcg/mL.  Height: 5\' 4"  (162.6 cm) Weight: 164 lb (74.4 kg) IBW/kg (Calculated) : 54.7  Temp (24hrs), Avg:97.8 F (36.6 C), Min:97.5 F (36.4 C), Max:98.4 F (36.9 C)   Recent Labs Lab 07/31/16 2053  WBC 6.5  CREATININE 0.58    Estimated Creatinine Clearance: 85.9 mL/min (by C-G formula based on SCr of 0.58 mg/dL).    Allergies  Allergen Reactions  . Other Swelling    *Avacodo* swelling of the abdomen   . Vantin [Cefpodoxime]     Unknown reaction   . Codeine Other (See Comments)    AGITATION    Antimicrobials this admission: Vancomycin 11/15 >>  Dose adjustments this admission: -  Microbiology results: Pending  Thank you for allowing pharmacy to be a part of this patient's care.  Nani Skillern Crowford 08/01/2016 4:45 AM

## 2016-08-01 NOTE — Op Note (Signed)
Michelle Mann, Michelle Mann NO.:  [id number]  MEDICAL RECORD NO.:  [id number]  LOCATION:  44                         FACILITY:  [facility]  PHYSICIAN:  Satira Anis. Kaylanie Capili, M.D.DATE OF BIRTH:  Feb 18, 1971  DATE OF PROCEDURE: DATE OF DISCHARGE:                              OPERATIVE REPORT   PREOPERATIVE DIAGNOSES:  Open fifth metacarpal fracture with associated soft tissue damage about the fourth webspace and noted dorsal and volar lacerations.  POSTOPERATIVE DIAGNOSES:  Open fifth metacarpal fracture with associated soft tissue damage about the fourth webspace and noted dorsal and volar lacerations.  PROCEDURE PERFORMED: 1. Irrigation and debridement, of open fracture, left hand fifth     metacarpal fracture. 2. Open reduction and internal fixation of fifth metacarpal fracture     with Kirschner wire fixation. 3. AP, lateral, and oblique x-rays (4-view x-ray series left hand). 4. Radial digital nerve neurolysis, left small finger.  Noted to be     intact. 5. Ulnar digital nerve neurolysis, left ring finger.  Noted to be     intact. 6. Complex webspace repair over a 4 cm region.  This was a complex     closure of the large webspace laceration.  SURGEON:  Satira Anis. Amedeo Plenty, M.D.  ASSISTANT:  None.  COMPLICATIONS:  None.  ANESTHESIA:  General.  TOURNIQUET TIME:  Less than 30 minutes.  INDICATIONS:  A 45 year old female with the above-mentioned injury.  She presents for I and D, reconstruction as necessary about her left nondominant extremity.  OPERATION IN DETAIL:  The patient was seen by myself and Anesthesia, taken to the operative theater, underwent smooth induction of general anesthetic, prepped and draped in the usual sterile fashion with Betadine scrub and paint as well as Hibiclens scrub performed by myself. Outline marks were made visually, sterile field secured, time-out called, and preoperative Ancef was given.  Following this, the patient then  underwent a very careful and cautious irrigation and debridement approach to the area.  A 3 L of saline were placed through and through. She had irrigation and debridement of skin, subcutaneous tissue, muscle, and tendon through a 4 cm region, this was an open fracture and was an excisional debridement.  Following I and D, drapes were changed and the patient underwent evaluation of the web space.  Radial digital nerve to the small finger underwent a neurolysis.  It was intact.  Radial digital artery was lacerated and this was coagulated as the patient had good refill. Following this, the patient then had the ulnar digital nerve to the ring finger neurolysed, which fortunately was noted to be intact.  Once again refill looked excellent and the associated radial digital artery was patent.  The patient tolerated the I and D, and the neurolysis without difficulty.  Following this, x-ray was brought in.  The open fracture was manipulated and pinned with 0.045 K-wire, I performed very careful placement of the pin as the bone was fairly osteopenic.  Following reduction, this was confirmed with 4-view x-ray series and I left one pin outside the skin away from the laceration site.  Following this, with combination of 4-0 chromic and 5-0 Prolene, I performed a complex 4  cm laceration repair about the volar web with dorsal and volar extensions over 4 cm length.  Refill was excellent.  Tourniquet time was brief and during the irrigation and debridement at less than 30 minutes.  The patient tolerated the procedure well.  She had excellent refill.  She was placed in bandage of Adaptic, Xeroform, 4x4s, gauze, Webril and a volar splint. The patient tolerated the procedure well.  There were no complicating features.  All sponge, needle, and instrument counts were reported as correct.  We will see her back in the office in 10-14 days.  Overnight stay for antibiotics.  Discharged home on antibiotics and  pain medicine appropriate to needs.  All questions have been encouraged and answered.     Satira Anis. Amedeo Plenty, M.D.     [facility]  D:  07/31/2016  T:  08/01/2016  Job:  [id number]

## 2016-09-20 ENCOUNTER — Other Ambulatory Visit: Payer: Self-pay | Admitting: Neurological Surgery

## 2016-09-20 DIAGNOSIS — M5416 Radiculopathy, lumbar region: Secondary | ICD-10-CM

## 2016-10-02 ENCOUNTER — Ambulatory Visit
Admission: RE | Admit: 2016-10-02 | Discharge: 2016-10-02 | Disposition: A | Discharge status: Home / Self Care | Payer: BLUE CROSS/BLUE SHIELD | Source: Ambulatory Visit | Attending: Neurological Surgery | Admitting: Neurological Surgery

## 2016-10-02 DIAGNOSIS — M5416 Radiculopathy, lumbar region: Secondary | ICD-10-CM

## 2016-10-02 MED ORDER — GADOBENATE DIMEGLUMINE 529 MG/ML IV SOLN
15.0000 mL | Freq: Once | INTRAVENOUS | Status: AC | PRN
  Administered 2016-10-02: 15 mL via INTRAVENOUS

## 2017-06-28 ENCOUNTER — Other Ambulatory Visit: Payer: Self-pay | Admitting: Physician Assistant

## 2017-07-01 ENCOUNTER — Other Ambulatory Visit: Payer: Self-pay | Admitting: Physician Assistant

## 2017-07-01 DIAGNOSIS — M5416 Radiculopathy, lumbar region: Secondary | ICD-10-CM

## 2017-07-07 ENCOUNTER — Ambulatory Visit
Admission: RE | Admit: 2017-07-07 | Discharge: 2017-07-07 | Disposition: A | Discharge status: Home / Self Care | Payer: BLUE CROSS/BLUE SHIELD | Source: Ambulatory Visit | Attending: Physician Assistant | Admitting: Physician Assistant

## 2017-07-07 DIAGNOSIS — M5416 Radiculopathy, lumbar region: Secondary | ICD-10-CM

## 2017-07-12 ENCOUNTER — Other Ambulatory Visit: Payer: BLUE CROSS/BLUE SHIELD

## 2018-01-22 DIAGNOSIS — R922 Inconclusive mammogram: Secondary | ICD-10-CM | POA: Insufficient documentation

## 2018-04-19 IMAGING — MR MR LUMBAR SPINE W/O CM
4 of 5 series · 25 of 48 positions shown · non-contrast
Comparison: MRI lumbar spine without and with contrast 10/02/2016.
Preoperative MRI lumbar, 02/07/2016.

CLINICAL DATA: Low back and RIGHT leg pain and weakness, continuing
after surgery.

EXAM:
MRI LUMBAR SPINE WITHOUT CONTRAST
TECHNIQUE: Multiplanar, multisequence MR imaging of the lumbar spine was
performed. No intravenous contrast was administered.

[Series 2: T2 · sagittal · 4.0mm · 0.55mm/px · 6 of 15 slices shown (1 of 2)]
[im 1/15]
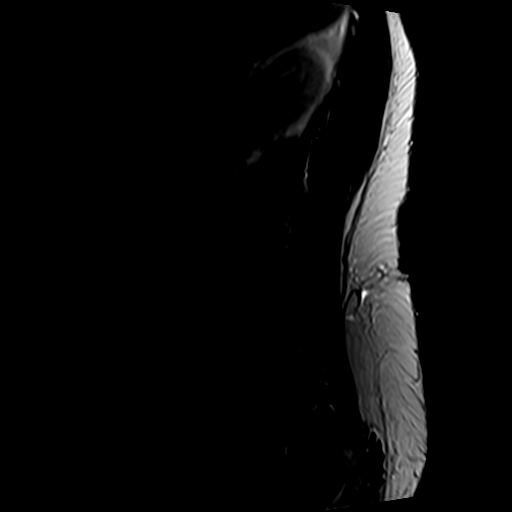
[im 3/15]
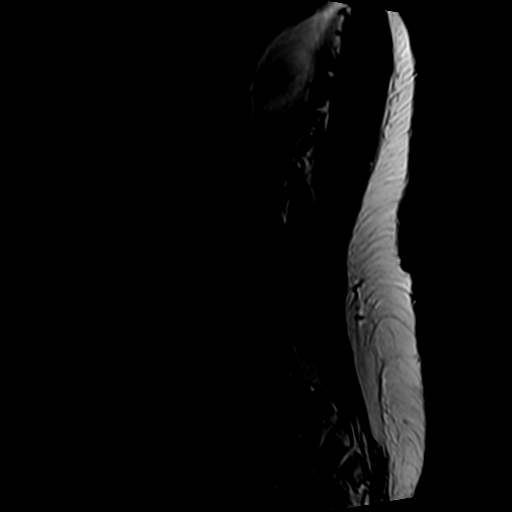
[im 6/15]
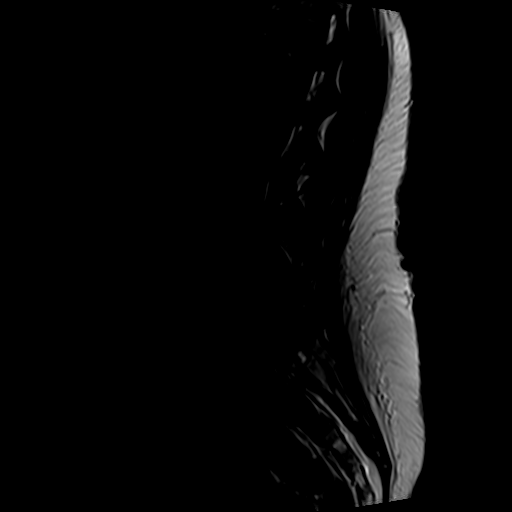
[im 9/15]
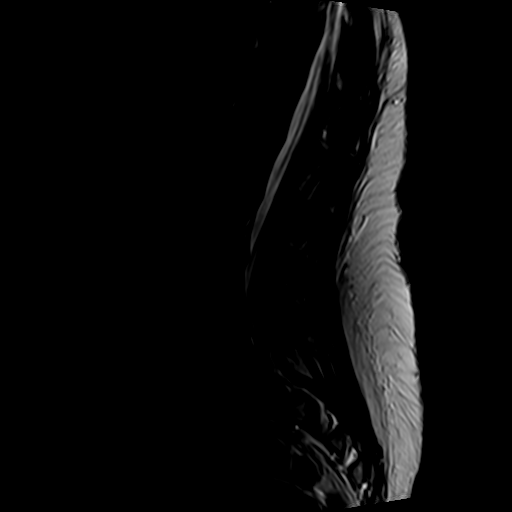
[im 12/15]
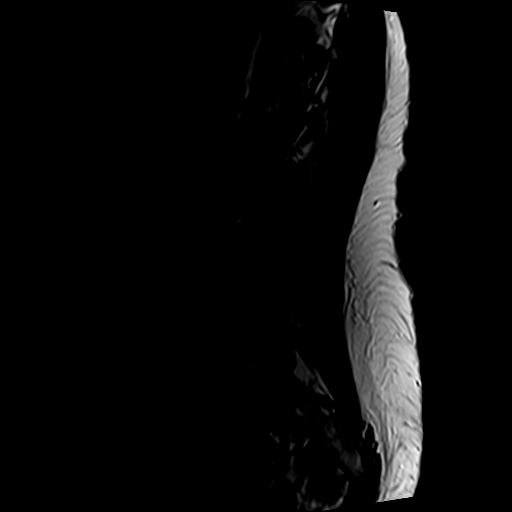
[im 15/15]
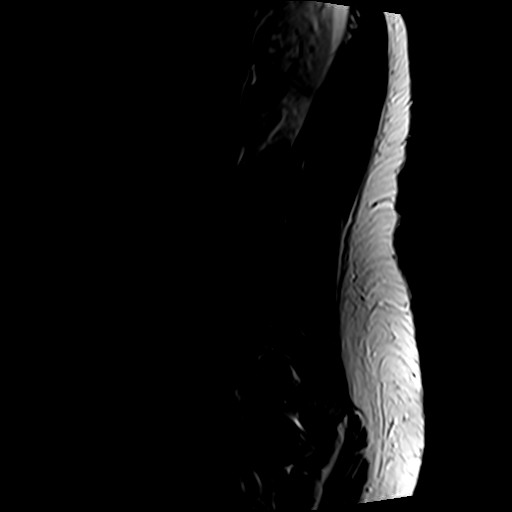

[Series 4: T1 · sagittal · 4.0mm · 0.55mm/px · 6 of 15 slices shown (1 of 2)]
[im 1/15]
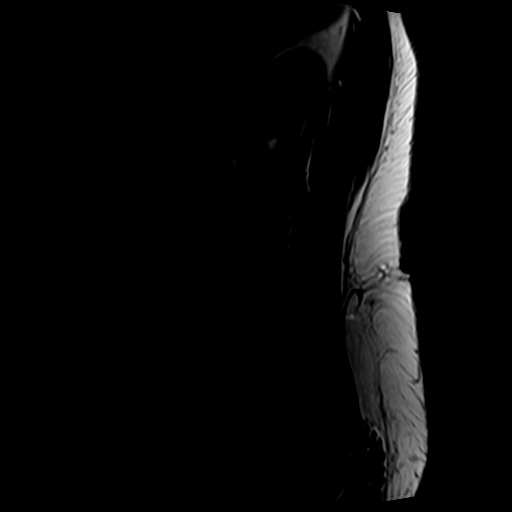
[im 3/15]
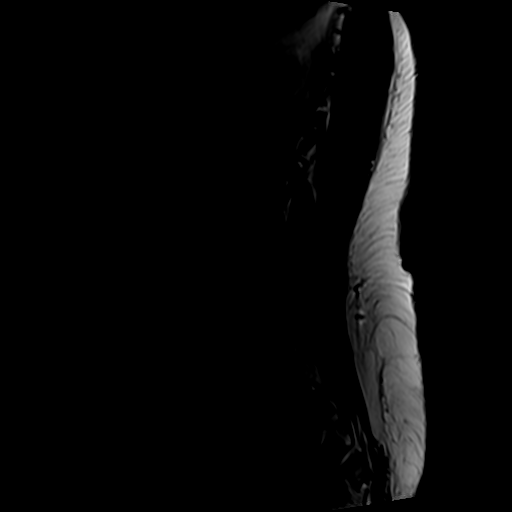
[im 6/15]
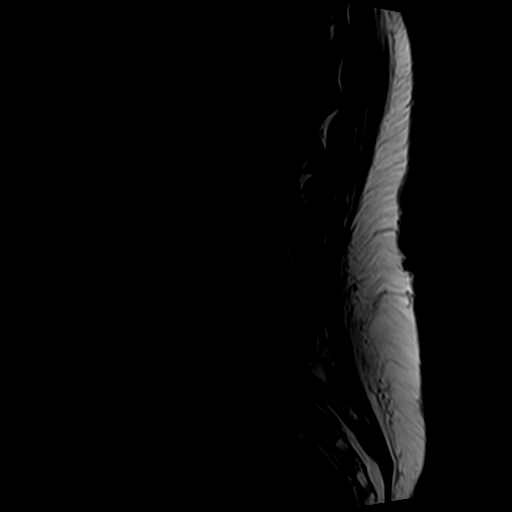
[im 9/15]
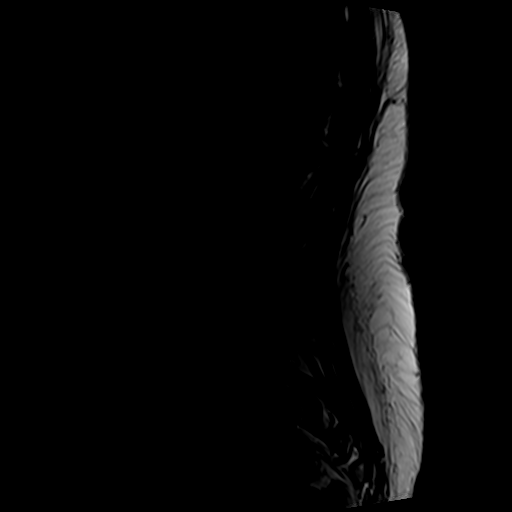
[im 12/15]
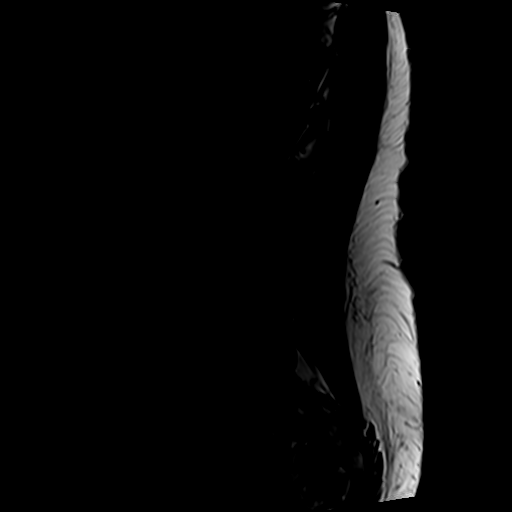
[im 15/15]
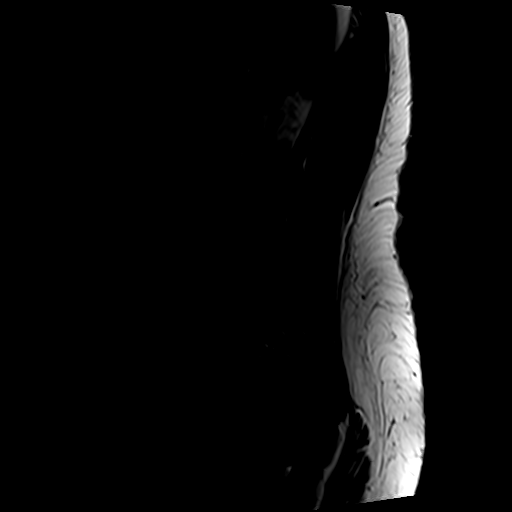

[Series 5: T2 · axial · 4.0mm · 0.78mm/px · z∈[-41,+173]mm · 9 of 38 slices shown (2 of 2)]
[im 1/38]
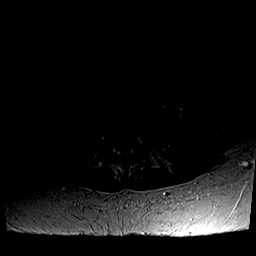
[im 6/38]
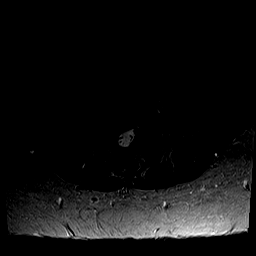
[im 11/38]
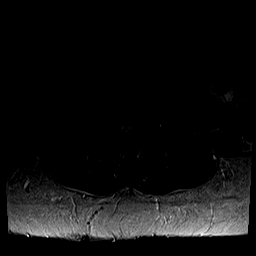
[im 16/38]
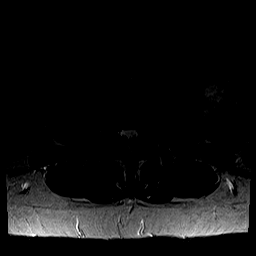
[im 19/38]
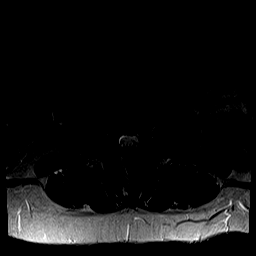
[im 22/38]
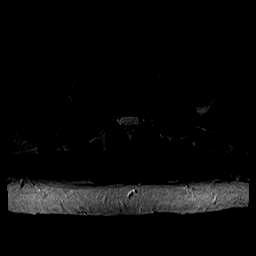
[im 27/38]
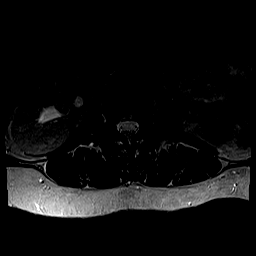
[im 32/38]
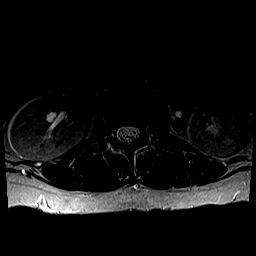
[im 38/38]
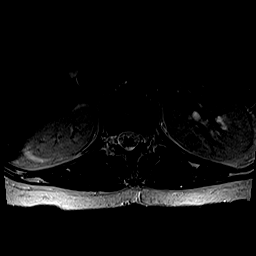

[Series 6: T1 · axial · 4.0mm · 0.39mm/px · z∈[-41,+145]mm · 4 of 38 slices shown (2 of 2)]
[im 1/38]
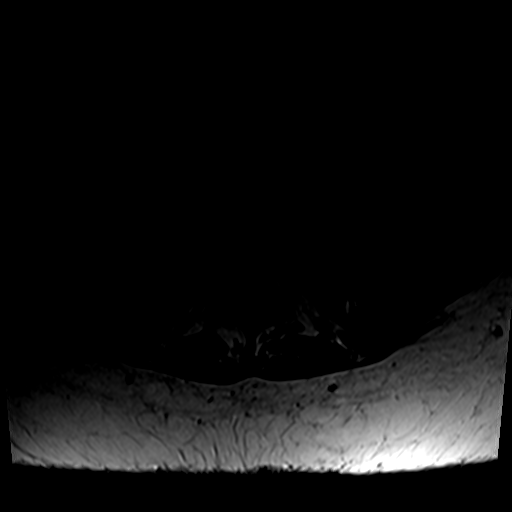
[im 6/38]
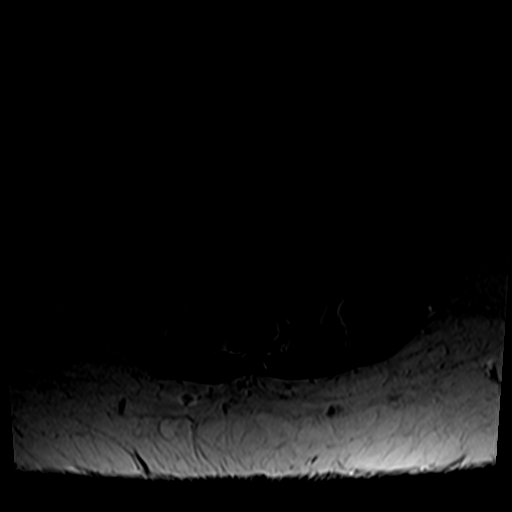
[im 19/38]
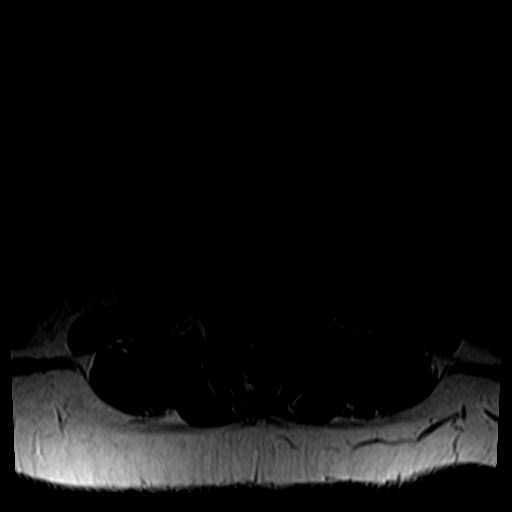
[im 32/38]
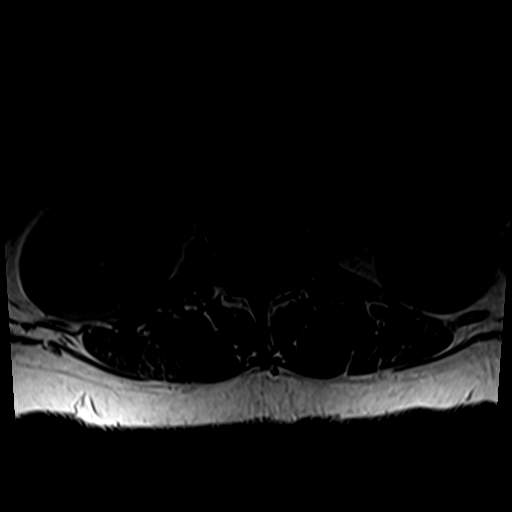

[25 of 48 positions shown; findings below may reference images not displayed]

FINDINGS: Segmentation:  Standard.

Alignment:  Physiologic.

Vertebrae:  No fracture, evidence of discitis, or bone lesion.

Conus medullaris: Extends to the L1 level and appears normal.

Paraspinal and other soft tissues: Unchanged and unremarkable.
Probable fibroid uterus.

Disc levels:

L1-L2:  Normal.

L2-L3:  Normal.

L3-L4: Partially involuted disc herniation. No current subarticular
zone or foraminal zone narrowing. Mild facet arthropathy.

L4-L5: Annular bulge. Posterior element hypertrophy affecting facets
and ligamentum flavum. Mild to moderate canal stenosis. Borderline
LEFT L5 nerve root impingement due to subarticular zone narrowing.

L5-S1: RIGHT laminotomy. Increased prominence of disc material,
associated with annular rent, central and to the RIGHT. RIGHT S1
nerve root impingement is possible, although the degree of lateral
and dorsal displacement does not appear significantly worse from
[place]. No definite stenosis or significant foraminal narrowing.

Review of the postoperative exam from 10/02/2016 reveals enhancing
granulation tissue at the operative site, RIGHT subarticular zone
and surgical bed, without focal fluid collection. The RIGHT S1 nerve
root was mildly displaced and enhancing on the previous study. In
that today's study was not performed with administration of
contrast, it is difficult to assess whether there has been true
progression.
IMPRESSION: Status post RIGHT L5-S1 laminotomy and discectomy. Lateral and
dorsal displacement of the RIGHT S1 nerve root not clearly
progressed from September 2016. Increasing prominence of ventral disc
material at L5-S1, uncertain significance.

Mild to moderate stenosis at L4-5 due to annular bulge and posterior
element hypertrophy.

## 2018-09-20 ENCOUNTER — Ambulatory Visit: Payer: Self-pay | Admitting: Nurse Practitioner

## 2018-09-20 ENCOUNTER — Encounter: Payer: Self-pay | Admitting: Nurse Practitioner

## 2018-09-20 VITALS — BP 114/70 | HR 88 | Temp 98.6°F | Resp 12 | Wt 179.2 lb

## 2018-09-20 DIAGNOSIS — J209 Acute bronchitis, unspecified: Secondary | ICD-10-CM

## 2018-09-20 MED ORDER — ALBUTEROL SULFATE HFA 108 (90 BASE) MCG/ACT IN AERS: 2.0000 | INHALATION_SPRAY | Freq: Four times a day (QID) | RESPIRATORY_TRACT | 0 refills | Status: DC | PRN

## 2018-09-20 MED ORDER — PSEUDOEPH-BROMPHEN-DM 30-2-10 MG/5ML PO SYRP: 5.0000 mL | ORAL_SOLUTION | Freq: Four times a day (QID) | ORAL | 0 refills | Status: AC | PRN

## 2018-09-20 MED ORDER — PREDNISONE 10 MG (21) PO TBPK: ORAL_TABLET | ORAL | 0 refills | Status: AC

## 2018-09-20 NOTE — Patient Instructions (Signed)
Acute Bronchitis, Adult -Take medication as prescribed. -Ibuprofen or Tylenol for pain, fever, or general discomfort. -Increase fluids. -Sleep elevated on at least 2 pillows at bedtime to help with cough. -Use a humidifier or vaporizer when at home and during sleep to help with cough. -May use a teaspoon of honey or over-the-counter cough drops to help with cough. -Follow-up if symptoms do not improve within the next 7-10 days or sooner if current symptoms worsen.   Acute bronchitis is sudden (acute) swelling of the air tubes (bronchi) in the lungs. Acute bronchitis causes these tubes to fill with mucus, which can make it hard to breathe. It can also cause coughing or wheezing. In adults, acute bronchitis usually goes away within 2 weeks. A cough caused by bronchitis may last up to 3 weeks. Smoking, allergies, and asthma can make the condition worse. Repeated episodes of bronchitis may cause further lung problems, such as chronic obstructive pulmonary disease (COPD). What are the causes? This condition can be caused by germs and by substances that irritate the lungs, including:  Cold and flu viruses. This condition is most often caused by the same virus that causes a cold.  Bacteria.  Exposure to tobacco smoke, dust, fumes, and air pollution. What increases the risk? This condition is more likely to develop in people who:  Have close contact with someone with acute bronchitis.  Are exposed to lung irritants, such as tobacco smoke, dust, fumes, and vapors.  Have a weak immune system.  Have a respiratory condition such as asthma. What are the signs or symptoms? Symptoms of this condition include:  A cough.  Coughing up clear, yellow, or green mucus.  Wheezing.  Chest congestion.  Shortness of breath.  A fever.  Body aches.  Chills.  A sore throat. How is this diagnosed? This condition is usually diagnosed with a physical exam. During the exam, your health care  provider may order tests, such as chest X-rays, to rule out other conditions. He or she may also:  Test a sample of your mucus for bacterial infection.  Check the level of oxygen in your blood. This is done to check for pneumonia.  Do a chest X-ray or lung function testing to rule out pneumonia and other conditions.  Perform blood tests. Your health care provider will also ask about your symptoms and medical history. How is this treated? Most cases of acute bronchitis clear up over time without treatment. Your health care provider may recommend:  Drinking more fluids. Drinking more makes your mucus thinner, which may make it easier to breathe.  Taking a medicine for a fever or cough.  Taking an antibiotic medicine.  Using an inhaler to help improve shortness of breath and to control a cough.  Using a cool mist vaporizer or humidifier to make it easier to breathe. Follow these instructions at home: Medicines  Take over-the-counter and prescription medicines only as told by your health care provider.  If you were prescribed an antibiotic, take it as told by your health care provider. Do not stop taking the antibiotic even if you start to feel better. General instructions   Get plenty of rest.  Drink enough fluids to keep your urine pale yellow.  Avoid smoking and secondhand smoke. Exposure to cigarette smoke or irritating chemicals will make bronchitis worse. If you smoke and you need help quitting, ask your health care provider. Quitting smoking will help your lungs heal faster.  Use an inhaler, cool mist vaporizer, or humidifier as told  by your health care provider.  Keep all follow-up visits as told by your health care provider. This is important. How is this prevented? To lower your risk of getting this condition again:  Wash your hands often with soap and water. If soap and water are not available, use hand sanitizer.  Avoid contact with people who have cold  symptoms.  Try not to touch your hands to your mouth, nose, or eyes.  Make sure to get the flu shot every year. Contact a health care provider if:  Your symptoms do not improve in 2 weeks of treatment. Get help right away if:  You cough up blood.  You have chest pain.  You have severe shortness of breath.  You become dehydrated.  You faint or keep feeling like you are going to faint.  You keep vomiting.  You have a severe headache.  Your fever or chills gets worse. This information is not intended to replace advice given to you by your health care provider. Make sure you discuss any questions you have with your health care provider. Document Released: 10/11/2004 Document Revised: 04/17/2017 Document Reviewed: 02/22/2016 Elsevier Interactive Patient Education  2019 [employer].

## 2018-09-20 NOTE — Progress Notes (Signed)
Subjective:  Michelle Mann is a 48 y.o. female who presents for evaluation of URI like symptoms.  Symptoms include nasal congestion, no fever, productive cough with yellow colored sputum and chest congestion and fatigue.  Onset of symptoms was 3 days ago, and has been gradually worsening since that time.  Treatment to date:  OTC cough and cold medicine.  High risk factors for influenza complications:  none.  The following portions of the patient's history were reviewed and updated as appropriate:  allergies, current medications and past medical history.  Constitutional: positive for anorexia and fatigue, negative for chills, fevers and malaise Eyes: negative Ears, nose, mouth, throat, and face: positive for nasal congestion, negative for ear drainage, earaches and sore throat Respiratory: positive for cough, sputum and wheezing, negative for asthma, chronic bronchitis, dyspnea on exertion and pleurisy/chest pain Cardiovascular: negative Gastrointestinal: positive for decreased appetite, negative for abdominal pain, nausea and vomiting Neurological: positive for headaches, negative for coordination problems, dizziness, gait problems, paresthesia, vertigo and weakness Objective:  BP 114/70   Pulse 88   Temp 98.6 F (37 C)   Resp 12   Wt 179 lb 3.2 oz (81.3 kg)   SpO2 95%   BMI 30.76 kg/m  General appearance: alert, cooperative and no distress Head: Normocephalic, without obvious abnormality, atraumatic Eyes: conjunctivae/corneas clear. PERRL, EOM's intact. Fundi benign. Ears: normal TM's and external ear canals both ears Nose: no discharge, moderate congestion, turbinates swollen, inflamed, sinus tenderness maxillary, bilaterally Throat: lips, mucosa, and tongue normal; teeth and gums normal Lungs: rhonchi posterior - LLL Heart: regular rate and rhythm, S1, S2 normal, no murmur, click, rub or gallop Abdomen: soft, non-tender; bowel sounds normal; no masses,  no organomegaly Pulses: 2+  and symmetric Skin: Skin color, texture, turgor normal. No rashes or lesions Lymph nodes: cervical and submandibular nodes normal Neurologic: Grossly normal    Assessment:  Acute bronchitis    Plan:  Exam findings, diagnosis etiology and medication use and indications reviewed with patient. Follow- Up and discharge instructions provided. No emergent/urgent issues found on exam.  On the patient's clinical presentation, symptoms, physical assessment, along with duration of symptoms, patient symptoms are most likely viral etiology.  Patient does not have any fever, vital signs are stable, therefore do not feel antibiotics are warranted at this time.  Discussed viral etiology with patient patient verbalized understanding.  Will prescribe symptomatic treatment to include prednisone, Bromfed, and albuterol inhaler.  If the patient's symptoms worsen to include fever, worsening cough, shortness of breath or other concerns, we will consider an antibiotic at that time.  Patient education was provided. Patient verbalized understanding of information provided and agrees with plan of care (POC), all questions answered. The patient is advised to call or return to clinic if condition does not see an improvement in symptoms, or to seek the care of the closest emergency department if condition worsens with the above plan.   1. Acute bronchitis, unspecified organism  - predniSONE (STERAPRED UNI-PAK 21 TAB) 10 MG (21) TBPK tablet; Take as directed.  Dispense: 21 tablet; Refill: 0 - brompheniramine-pseudoephedrine-DM 30-2-10 MG/5ML syrup; Take 5 mLs by mouth 4 (four) times daily as needed for up to 7 days.  Dispense: 150 mL; Refill: 0 - albuterol (PROVENTIL HFA;VENTOLIN HFA) 108 (90 Base) MCG/ACT inhaler; Inhale 2 puffs into the lungs every 6 (six) hours as needed for up to 10 days for wheezing or shortness of breath.  Dispense: 1 Inhaler; Refill: 0 -Take medication as prescribed. -Ibuprofen or Tylenol  for pain,  fever, or general discomfort. -Increase fluids. -Sleep elevated on at least 2 pillows at bedtime to help with cough. -Use a humidifier or vaporizer when at home and during sleep to help with cough. -May use a teaspoon of honey or over-the-counter cough drops to help with cough. -Follow-up if symptoms do not improve within the next 7-10 days or sooner if current symptoms worsen.

## 2020-03-28 ENCOUNTER — Telehealth: Payer: Self-pay | Admitting: Podiatry

## 2020-03-28 NOTE — Telephone Encounter (Signed)
You just returned my call and I'm sorry, when I answered I put my phone on mute by accident and that's why you couldn't hear me talking. My apologies. Thank you.

## 2020-03-28 NOTE — Telephone Encounter (Signed)
I have an appointment with Dr. Paulla Dolly next Thursday, 07-22 at 1:30 pm. I'm going to be a self pay patient and I know I have to pay the $200 up front. I was trying to get an idea if he does x-rays, trying to get a general idea of how much I would need to have available or what the cost would be. If you could give me a call at [phone] I would greatly appreciate it. Thanks and I hope you have a great day.

## 2020-04-07 ENCOUNTER — Ambulatory Visit (INDEPENDENT_AMBULATORY_CARE_PROVIDER_SITE_OTHER): Payer: Self-pay

## 2020-04-07 ENCOUNTER — Encounter: Payer: Self-pay | Admitting: Podiatry

## 2020-04-07 ENCOUNTER — Other Ambulatory Visit: Payer: Self-pay

## 2020-04-07 ENCOUNTER — Other Ambulatory Visit: Payer: Self-pay | Admitting: Podiatry

## 2020-04-07 ENCOUNTER — Ambulatory Visit (INDEPENDENT_AMBULATORY_CARE_PROVIDER_SITE_OTHER): Payer: Self-pay | Admitting: Podiatry

## 2020-04-07 VITALS — Temp 97.8°F

## 2020-04-07 DIAGNOSIS — M722 Plantar fascial fibromatosis: Secondary | ICD-10-CM

## 2020-04-07 DIAGNOSIS — M79672 Pain in left foot: Secondary | ICD-10-CM

## 2020-04-07 MED ORDER — NAPROXEN 500 MG PO TABS: 500.0000 mg | ORAL_TABLET | Freq: Two times a day (BID) | ORAL | 2 refills | Status: DC

## 2020-04-07 NOTE — Patient Instructions (Signed)

## 2020-04-11 NOTE — Progress Notes (Signed)
Subjective:   Patient ID: Michelle Mann, female   DOB: 49 y.o.   MRN: [id number]   HPI Patient presents stating that she has pain in the bottom of her left heel that is been making it hard for her to walk comfortably.  States is been gradually becoming more of an issue for her and it is painful with palpation.  Patient did stepped off a curb and that is when it seemed like it started and has tried cupping and is tried dry needling.  Patient does not smoke likes to be active   Review of Systems  All other systems reviewed and are negative.       Objective:  Physical Exam Vitals and nursing note reviewed.  Constitutional:      Appearance: She is well-developed.  Pulmonary:     Effort: Pulmonary effort is normal.  Musculoskeletal:        General: Normal range of motion.  Skin:    General: Skin is warm.  Neurological:     Mental Status: She is alert.     Neurovascular status intact muscle strength found to be adequate range of motion within normal limits.  Patient is found to have exquisite discomfort plantar aspect left heel at the insertional point of the tendon into the calcaneus with inflammation of the insertional point and also is noted to have moderate flatfoot deformity.  Patient did not have muscle strength loss and I did not note any kind of equinus condition currently     Assessment:  Acute plantar fasciitis left foot injury that probably brought on the symptoms that have become chronic     Plan:  H&P educated her on this and today I did a sterile prep and injected the fascial insertion 3 mg Kenalog 5 mg Xylocaine and applied sterile dressing.  Gave instructions on physical therapy shoe gear modifications and I dispensed a plantar fascial brace to lift up the arch and take pressure off the plantar heel.  Patient will be seen back to recheck  X-rays indicate no signs of stress fracture of the heel bone minimal spur formation

## 2020-04-21 ENCOUNTER — Other Ambulatory Visit: Payer: Self-pay

## 2020-04-21 ENCOUNTER — Encounter: Payer: Self-pay | Admitting: Podiatry

## 2020-04-21 ENCOUNTER — Ambulatory Visit (INDEPENDENT_AMBULATORY_CARE_PROVIDER_SITE_OTHER): Payer: Self-pay | Admitting: Podiatry

## 2020-04-21 VITALS — Temp 97.7°F

## 2020-04-21 DIAGNOSIS — M722 Plantar fascial fibromatosis: Secondary | ICD-10-CM

## 2020-04-21 NOTE — Progress Notes (Signed)
Subjective:   Patient ID: Michelle Mann, female   DOB: 49 y.o.   MRN: [id number]   HPI Patient states I am improved but I am still having one area of discomfort plantar heel left   ROS      Objective:  Physical Exam  Neurovascular status intact with pain left plantar fascial still present upon deep palpation     Assessment:  Plantar fasciitis left still present with improvement     Plan:  Reviewed condition physical therapy anti-inflammatories and I did do sterile prep reinjected the fascia 3 mg Kenalog 5 mg Xylocaine and she can resume her normal walking and if symptoms persist she will be seen back

## 2021-07-19 ENCOUNTER — Ambulatory Visit (INDEPENDENT_AMBULATORY_CARE_PROVIDER_SITE_OTHER): Payer: Self-pay | Admitting: Podiatry

## 2021-07-19 ENCOUNTER — Encounter: Payer: Self-pay | Admitting: Podiatry

## 2021-07-19 ENCOUNTER — Other Ambulatory Visit: Payer: Self-pay

## 2021-07-19 ENCOUNTER — Ambulatory Visit (INDEPENDENT_AMBULATORY_CARE_PROVIDER_SITE_OTHER): Payer: Self-pay

## 2021-07-19 DIAGNOSIS — S99921A Unspecified injury of right foot, initial encounter: Secondary | ICD-10-CM

## 2021-07-19 NOTE — Progress Notes (Signed)
Subjective:   Patient ID: Michelle Mann, female   DOB: 50 y.o.   MRN: [id number]   HPI Patient presents stating that she injured her right foot a day ago and a pallet jack fell on it and while its been sore she is concerned about fracture with history of fracture.   ROS      Objective:  Physical Exam  Neurovascular status intact with inflammation mostly centered around the first MPJ right slightly into the second and third with low-grade swelling noted currently     Assessment:  Cannot rule out any kind of bone pathology versus soft tissue pathology from significant injury sustained 1 day ago     Plan:  H&P x-ray reviewed recommended ice therapy rigid bottom shoes and that the pain may take a while to get rid of but should eventually knock itself out.  Patient is discharged will be seen back as needed  X-rays were negative for signs of fracture or bony pathology

## 2022-09-25 ENCOUNTER — Encounter (HOSPITAL_COMMUNITY): Payer: Self-pay

## 2022-09-25 ENCOUNTER — Emergency Department (HOSPITAL_COMMUNITY)
Admission: EM | Admit: 2022-09-25 | Discharge: 2022-09-25 | Disposition: A | Discharge status: Home / Self Care | Payer: BLUE CROSS/BLUE SHIELD | Attending: Emergency Medicine | Admitting: Emergency Medicine

## 2022-09-25 ENCOUNTER — Other Ambulatory Visit: Payer: Self-pay

## 2022-09-25 ENCOUNTER — Emergency Department (HOSPITAL_COMMUNITY): Payer: BLUE CROSS/BLUE SHIELD

## 2022-09-25 DIAGNOSIS — I10 Essential (primary) hypertension: Secondary | ICD-10-CM | POA: Diagnosis not present

## 2022-09-25 DIAGNOSIS — M5441 Lumbago with sciatica, right side: Secondary | ICD-10-CM | POA: Insufficient documentation

## 2022-09-25 DIAGNOSIS — E039 Hypothyroidism, unspecified: Secondary | ICD-10-CM | POA: Diagnosis not present

## 2022-09-25 DIAGNOSIS — M545 Low back pain, unspecified: Secondary | ICD-10-CM | POA: Diagnosis present

## 2022-09-25 LAB — BASIC METABOLIC PANEL
Anion gap: 10 (ref 5–15)
BUN: 22 mg/dL — ABNORMAL HIGH (ref 6–20)
CO2: 24 mmol/L (ref 22–32)
Calcium: 9.1 mg/dL (ref 8.9–10.3)
Chloride: 104 mmol/L (ref 98–111)
Creatinine, Ser: 0.72 mg/dL (ref 0.44–1.00)
GFR, Estimated: >60 mL/min (ref >60)
Glucose, Bld: 83 mg/dL (ref 70–99)
Potassium: 3.9 mmol/L (ref 3.5–5.1)
Sodium: 138 mmol/L (ref 135–145)

## 2022-09-25 LAB — CBC WITH DIFFERENTIAL/PLATELET
Abs Immature Granulocytes: 0.01 10*3/uL (ref 0.00–0.07)
Basophils Absolute: 0 10*3/uL (ref 0.0–0.1)
Basophils Relative: 1 %
Eosinophils Absolute: 0.1 10*3/uL (ref 0.0–0.5)
Eosinophils Relative: 3 %
HCT: 42.8 % (ref 36.0–46.0)
Hemoglobin: 13.6 g/dL (ref 12.0–15.0)
Immature Granulocytes: 0 %
Lymphocytes Relative: 47 %
Lymphs Abs: 2.4 10*3/uL (ref 0.7–4.0)
MCH: 29.4 pg (ref 26.0–34.0)
MCHC: 31.8 g/dL (ref 30.0–36.0)
MCV: 92.6 fL (ref 80.0–100.0)
Monocytes Absolute: 0.4 10*3/uL (ref 0.1–1.0)
Monocytes Relative: 8 %
Neutro Abs: 2 10*3/uL (ref 1.7–7.7)
Neutrophils Relative %: 41 %
Platelets: 244 10*3/uL (ref 150–400)
RBC: 4.62 MIL/uL (ref 3.87–5.11)
RDW: 13.3 % (ref 11.5–15.5)
WBC: 5 10*3/uL (ref 4.0–10.5)
nRBC: 0 % (ref 0.0–0.2)

## 2022-09-25 LAB — URINALYSIS, ROUTINE W REFLEX MICROSCOPIC
Bilirubin Urine: NEGATIVE
Glucose, UA: NEGATIVE mg/dL
Hgb urine dipstick: NEGATIVE
Ketones, ur: 5 mg/dL — AB
Leukocytes,Ua: NEGATIVE
Nitrite: NEGATIVE
Protein, ur: NEGATIVE mg/dL
Specific Gravity, Urine: 1.005 (ref 1.005–1.030)
pH: 7 (ref 5.0–8.0)

## 2022-09-25 MED ORDER — KETOROLAC TROMETHAMINE 15 MG/ML IJ SOLN
15.0000 mg | Freq: Once | INTRAMUSCULAR | Status: AC
  Administered 2022-09-25: 15 mg via INTRAVENOUS
  Filled 2022-09-25: qty 1

## 2022-09-25 MED ORDER — DEXAMETHASONE SODIUM PHOSPHATE 10 MG/ML IJ SOLN
10.0000 mg | Freq: Once | INTRAMUSCULAR | Status: AC
  Administered 2022-09-25: 10 mg via INTRAMUSCULAR
  Filled 2022-09-25: qty 1

## 2022-09-25 MED ORDER — LIDOCAINE 5 % EX PTCH
1.0000 | MEDICATED_PATCH | CUTANEOUS | Status: DC
  Administered 2022-09-25: 1 via TRANSDERMAL
  Filled 2022-09-25: qty 1

## 2022-09-25 MED ORDER — HYDROCODONE-ACETAMINOPHEN 5-325 MG PO TABS
2.0000 | ORAL_TABLET | Freq: Once | ORAL | Status: AC
  Administered 2022-09-25: 2 via ORAL
  Filled 2022-09-25: qty 2

## 2022-09-25 MED ORDER — PREDNISONE 10 MG (21) PO TBPK: ORAL_TABLET | Freq: Every day | ORAL | 0 refills | Status: AC

## 2022-09-25 MED ORDER — HYDROCODONE-ACETAMINOPHEN 5-325 MG PO TABS
1.0000 | ORAL_TABLET | Freq: Once | ORAL | Status: AC
  Administered 2022-09-25: 1 via ORAL
  Filled 2022-09-25: qty 1

## 2022-09-25 NOTE — ED Provider Notes (Signed)
[facility] EMERGENCY DEPARTMENT Provider Note   CSN: [id number] Arrival date & time: 09/25/22  1142     History  Chief Complaint  Patient presents with   Back Pain    Michelle Mann is a 52 y.o. female.   Back Pain    Patient with medical history of prior lower lumbar laminectomy/decompression, hypertension, hyperlipidemia, hypothyroid presents to the emergency department due to low back pain.  It started New Year's Day, started in the middle of her back and radiates down her right lower extremity posteriorly and then laterally.  She is also having tingling in her toes bilaterally as well as in her upper extremities was going on for the past few months.  She denies any specific falls or injuries to her low back.  She does endorse cleaning, sitting on chairs for prolonged periods of time and thinks that initially exacerbated it.  She has a history of sciatica and this feels similar with the tingling in her hands is atypical and new.  She denies any recent changes in her medication, any significant weight loss, any history of malignancy, chest pain, shortness of breath, dysuria, hematuria, flank pain, rashes.  Given Norco in triage which helped alleviate symptoms.  Home Medications Prior to Admission medications   Medication Sig Start Date End Date Taking? Authorizing Provider  predniSONE (STERAPRED UNI-PAK 21 TAB) 10 MG (21) TBPK tablet Take by mouth daily. Take 6 tabs by mouth daily  for 2 days, then 5 tabs for 2 days, then 4 tabs for 2 days, then 3 tabs for 2 days, 2 tabs for 2 days, then 1 tab by mouth daily for 2 days 09/25/22  Yes Sherrill Raring, PA-C  ALOE PO Take 1 capsule by mouth daily.    [provider]  Ascorbic Acid (VITAMIN C) 500 MG CAPS Take 500 mg by mouth daily.    [provider]  Biotin 1 MG CAPS Take 1 mg by mouth daily.    [provider]  Cholecalciferol (VITAMIN D3) 1000 units CAPS Take 1,000 Units by mouth daily.     [provider]  hydrochlorothiazide (MICROZIDE) 12.5 MG capsule Take 12.5 mg by mouth daily.    [provider]  MAGNESIUM GLYCINATE PLUS PO Take 1 tablet by mouth daily. In addition to mg sulfate    [provider]  Magnesium Sulfate 70 MG CAPS Take 70 mg by mouth daily.    [provider]  Menaquinone-7 (VITAMIN K2) 100 MCG CAPS Take 100 mcg by mouth daily.    [provider]  Multiple Vitamins-Minerals (ZINC) LOZG Take 1 lozenge by mouth daily as needed. supplement    [provider]  Omega-3 Fatty Acids (FISH OIL) 1000 MG CAPS Take 1,000 mg by mouth daily.    [provider]  thyroid (ARMOUR THYROID) 120 MG tablet TK 1 T PO QD 03/11/19   [provider]      Allergies    Other, Naproxen, Oxycodone, Vantin [cefpodoxime], and Codeine    Review of Systems   Review of Systems  Musculoskeletal:  Positive for back pain.    Physical Exam Updated Vital Signs BP 122/74   Pulse 77   Temp 98.4 F (36.9 C)   Resp 18   Ht '5\' 4"'$  (1.626 m)   Wt 81.3 kg   SpO2 99%   BMI 30.77 kg/m  Physical Exam Vitals and nursing note reviewed. Exam conducted with a chaperone present.  Constitutional:  Appearance: Normal appearance.  HENT:     Head: Normocephalic.  Eyes:     Extraocular Movements: Extraocular movements intact.     Pupils: Pupils are equal, round, and reactive to light.     Comments: No nystagmus   Neck:     Comments: No midline cervical tenderness. No palpable deformities.  Cardiovascular:     Rate and Rhythm: Normal rate and regular rhythm.     Pulses: Normal pulses.     Comments: DP, PT, and radial pulses 2+ and symmetrical bilaterally Pulmonary:     Effort: Pulmonary effort is normal.     Breath sounds: Normal breath sounds.  Abdominal:     Tenderness: There is no right CVA tenderness or left CVA tenderness.  Musculoskeletal:        General: Tenderness present.     Cervical back: Normal range of  motion. No rigidity or tenderness.     Comments: Positive straight leg raise right lower extremity  Skin:    General: Skin is warm and dry.     Capillary Refill: Capillary refill takes less than 2 seconds.     Findings: No bruising or erythema.  Neurological:     Mental Status: She is alert and oriented to person, place, and time. Mental status is at baseline.     Comments: Anicteric gait.  Upper and lower extremity strength is symmetric bilaterally.  Sensation upper and lower extremities is roughly intact.  Cranial nerves II through XII gross intact.  Psychiatric:        Mood and Affect: Mood normal.     ED Results / Procedures / Treatments   Labs (all labs ordered are listed, but only abnormal results are displayed) Labs Reviewed  BASIC METABOLIC PANEL - Abnormal; Notable for the following components:      Result Value   BUN 22 (*)    All other components within normal limits  URINALYSIS, ROUTINE W REFLEX MICROSCOPIC - Abnormal; Notable for the following components:   Color, Urine COLORLESS (*)    Ketones, ur 5 (*)    All other components within normal limits  CBC WITH DIFFERENTIAL/PLATELET    EKG None  Radiology CT Lumbar Spine Wo Contrast  Result Date: 09/25/2022 CLINICAL DATA:  Initial evaluation for acute low back pain, increased fracture risk. EXAM: CT LUMBAR SPINE WITHOUT CONTRAST TECHNIQUE: Multidetector CT imaging of the lumbar spine was performed without intravenous contrast administration. Multiplanar CT image reconstructions were also generated. RADIATION DOSE REDUCTION: This exam was performed according to the departmental dose-optimization program which includes automated exposure control, adjustment of the mA and/or kV according to patient size and/or use of iterative reconstruction technique. COMPARISON:  Prior MRI from 07/07/2017. FINDINGS: Segmentation: Standard. Lowest well-formed disc space labeled the L5-S1 level. Alignment: Physiologic with preservation of the  normal lumbar lordosis. No listhesis. Vertebrae: Vertebral body height maintained without acute or chronic fracture. Visualized sacrum and pelvis intact. No worrisome osseous lesions. Paraspinal and other soft tissues: Paraspinous soft tissues demonstrate no acute finding. Mild aortic atherosclerosis. Disc levels: L1-2: Normal interspace. Mild facet hypertrophy. No canal or foraminal stenosis. L2-3: Mild disc bulge. Mild bilateral facet hypertrophy. No significant spinal stenosis. Foramina remain patent. L3-4: Mild diffuse disc bulge. Superimposed small biforaminal disc protrusions contact the exiting L3 nerve roots bilaterally. Mild facet hypertrophy. Resultant mild bilateral subarticular stenosis. Central canal remains patent. Mild bilateral L3 foraminal narrowing. L4-5: Mild diffuse disc bulge. Moderate facet hypertrophy. Resultant mild canal with bilateral subarticular stenosis. Mild bilateral L4  foraminal narrowing. L5-S1: Degenerative intervertebral disc space narrowing with diffuse disc bulge. Prior right hemi laminectomy. Residual and/or recurrent broad-based right subarticular disc protrusion closely approximates the descending right S1 nerve root. Moderate facet hypertrophy. No significant canal or lateral recess stenosis. Mild bilateral L5 foraminal narrowing. IMPRESSION: 1. No acute osseous abnormality within the lumbar spine. 2. Prior right hemi laminectomy at L5-S1. Residual and/or recurrent broad-based right subarticular disc closely approximates and could potentially affect the descending right S1 nerve root at this level. 3. Additional mild degenerative spondylosis at L2-3 through L4-5 without significant stenosis. Aortic Atherosclerosis (ICD10-I70.0). Electronically Signed   By: Jeannine Boga M.D.   On: 09/25/2022 18:11    Procedures Procedures    Medications Ordered in ED Medications  lidocaine (LIDODERM) 5 % 1 patch (1 patch Transdermal Patch Applied 09/25/22 1724)   HYDROcodone-acetaminophen (NORCO/VICODIN) 5-325 MG per tablet 2 tablet (2 tablets Oral Given 09/25/22 1300)  ketorolac (TORADOL) 15 MG/ML injection 15 mg (15 mg Intravenous Given 09/25/22 1724)  HYDROcodone-acetaminophen (NORCO/VICODIN) 5-325 MG per tablet 1 tablet (1 tablet Oral Given 09/25/22 1832)  dexamethasone (DECADRON) injection 10 mg (10 mg Intramuscular Given 09/25/22 1935)    ED Course/ Medical Decision Making/ A&P Clinical Course as of 09/25/22 2237  Tue Sep 25, 2022  1825 CBC with Differential No leukocytosis or underlying anemia [HS]  2637 Basic metabolic panel(!) No gross electrolyte derangement or underlying AKI. [HS]    Clinical Course User Index [HS] Sherrill Raring, PA-C                           Medical Decision Making Amount and/or Complexity of Data Reviewed Labs: ordered. Decision-making details documented in ED Course. Radiology: ordered.  Risk Prescription drug management.   Patient presents due to low back pain.  Differential includes but not limited to cauda equina, epidural abscess, cord compression, metabolic abnormality, UTI although less likely given no urinary symptoms, AAA and dissection also considered but symmetric upper and lower extremities and think less likely given no chest pain, abdominal pain or upper back pain  Physical exam overall reassuring with symmetric upper and lower extremity pulses.  There is no appreciable focal deficit and she is able to ambulate across the room multiple times without assistance. -BP 122/74   Pulse 77   Temp 98.4 F (36.9 C)   Resp 18   Ht '5\' 4"'$  (1.626 m)   Wt 81.3 kg   SpO2 99%   BMI 30.77 kg/m   I ordered, viewed and interpreted laboratory workup as documented ED course.  I ordered, viewed imaging studies.  No acute abnormality.   Suspect patient's symptoms are secondary to underlying sciatica.  There is no gross metabolic  I discussed the workup with the patient.  I suspect this is an exacerbation of  underlying sciatica and will have her follow-up with neurosurgery regarding this.  She has previous surgery with [facility] but states she is looking for somebody closer to where she lives in [place] and has a referral already.  Will discharge with steroid Dosepak given concern for possible sciatic pain.  Also encouraged Tylenol and Motrin.    Do not think cauda equina, epidural abscess, cord compression.        Final Clinical Impression(s) / ED Diagnoses Final diagnoses:  Acute midline low back pain with right-sided sciatica    Rx / DC Orders ED Discharge Orders          Ordered  predniSONE (STERAPRED UNI-PAK 21 TAB) 10 MG (21) TBPK tablet  Daily        09/25/22 1841              Sherrill Raring, PA-C 09/25/22 2237    Drenda Freeze, MD 09/25/22 [phone]

## 2022-09-25 NOTE — ED Notes (Signed)
Patient reminded of the need for a urine sample at this time 

## 2022-09-25 NOTE — ED Notes (Signed)
Back from CT at this time.

## 2022-09-25 NOTE — ED Notes (Signed)
Ambulates to the restroom at this time with a steady gait 

## 2022-09-25 NOTE — ED Notes (Signed)
Patient transported to CT 

## 2022-09-25 NOTE — ED Notes (Signed)
Provider at bedside

## 2022-09-25 NOTE — ED Triage Notes (Addendum)
Pt c/o right groin pain then it went away. Pt states she sat in a chair for 5 hrs on New Year's and developed lower back pain the next day. Pt states the pain has gotten progressively worse. Pt states the back pain radiates to right hip and down to right foot. Pt states she gets int numbness and tingling. Pt denies incontinence. Pt is screaming out in pain in triage.

## 2022-09-25 NOTE — Discharge Instructions (Addendum)
Your workup today was reassuring. Your labs were reassigned, the CT showed signs of impingement of your S1 nerve. You should see neurosurgery for this.  Call your neurosurgeon or follow-up at [facility] [place], information above.  Return to the ED if you have any inability to walk, loss of bladder or bowel functions, fever or new or concerning symptoms.  See your doctor this week for reevaluation.  Take the Percocet prescription as prescribed.  He can also take Tylenol and Motrin for pain, recommend taking it on a rotating basis.  CT LUMBAR SPINE WITHOUT CONTRAST    TECHNIQUE:  Multidetector CT imaging of the lumbar spine was performed without  intravenous contrast administration. Multiplanar CT image  reconstructions were also generated.    RADIATION DOSE REDUCTION: This exam was performed according to the  departmental dose-optimization program which includes automated  exposure control, adjustment of the mA and/or kV according to  patient size and/or use of iterative reconstruction technique.    COMPARISON:  Prior MRI from 07/07/2017.    FINDINGS:  Segmentation: Standard. Lowest well-formed disc space labeled the  L5-S1 level.    Alignment: Physiologic with preservation of the normal lumbar  lordosis. No listhesis.    Vertebrae: Vertebral body height maintained without acute or chronic  fracture. Visualized sacrum and pelvis intact. No worrisome osseous  lesions.    Paraspinal and other soft tissues: Paraspinous soft tissues  demonstrate no acute finding. Mild aortic atherosclerosis.    Disc levels:    L1-2: Normal interspace. Mild facet hypertrophy. No canal or  foraminal stenosis.    L2-3: Mild disc bulge. Mild bilateral facet hypertrophy. No  significant spinal stenosis. Foramina remain patent.    L3-4: Mild diffuse disc bulge. Superimposed small biforaminal disc  protrusions contact the exiting L3 nerve roots bilaterally. Mild  facet hypertrophy. Resultant mild  bilateral subarticular stenosis.  Central canal remains patent. Mild bilateral L3 foraminal narrowing.    L4-5: Mild diffuse disc bulge. Moderate facet hypertrophy. Resultant  mild canal with bilateral subarticular stenosis. Mild bilateral L4  foraminal narrowing.    L5-S1: Degenerative intervertebral disc space narrowing with diffuse  disc bulge. Prior right hemi laminectomy. Residual and/or recurrent  broad-based right subarticular disc protrusion closely approximates  the descending right S1 nerve root. Moderate facet hypertrophy. No  significant canal or lateral recess stenosis. Mild bilateral L5  foraminal narrowing.    IMPRESSION:  1. No acute osseous abnormality within the lumbar spine.  2. Prior right hemi laminectomy at L5-S1. Residual and/or recurrent  broad-based right subarticular disc closely approximates and could  potentially affect the descending right S1 nerve root at this level.  3. Additional mild degenerative spondylosis at L2-3 through L4-5  without significant stenosis.

## 2022-09-25 NOTE — ED Provider Triage Note (Signed)
Emergency Medicine Provider Triage Evaluation Note  Michelle Mann , a 52 y.o. female  was evaluated in triage.  Pt complains of sciatica. .Pt reports she has had since new years after sitting in a hard chair   Review of Systems  Positive: Numbness in toes  Negative: fever  Physical Exam  BP (!) 129/91 (BP Location: Right Arm)   Pulse 83   Temp 98.3 F (36.8 C)   Resp (!) 24   Ht '5\' 4"'$  (1.626 m)   Wt 81.3 kg   SpO2 99%   BMI 30.77 kg/m  Gen:   Awake, screaming   Resp:  Normal effort  MSK:   Moves extremities without difficulty  Other:    Medical Decision Making  Medically screening exam initiated at 12:38 PM.  Appropriate orders placed.  Michelle Mann was informed that the remainder of the evaluation will be completed by another provider, this initial triage assessment does not replace that evaluation, and the importance of remaining in the ED until their evaluation is complete.     Fransico Meadow, [place] 09/25/22 1243

## 2022-09-25 NOTE — ED Notes (Signed)
Provider [facility] reports that she spoke with the patient about pain medication and what to take at home for pain. Patient ambulates to the lobby with a steady gait and verbalizes understanding of the plan of care
